# Patient Record
Sex: Female | Born: 1950 | Race: Black or African American | Hispanic: No | Marital: Married | Smoking: Former smoker
Health system: Southern US, Community
[De-identification: ages and names within clinical notes are randomized; demographics above are authoritative.]

## PROBLEM LIST (undated history)

## (undated) DIAGNOSIS — F419 Anxiety disorder, unspecified: Secondary | ICD-10-CM

## (undated) DIAGNOSIS — T7840XA Allergy, unspecified, initial encounter: Secondary | ICD-10-CM

## (undated) DIAGNOSIS — M1990 Unspecified osteoarthritis, unspecified site: Secondary | ICD-10-CM

## (undated) DIAGNOSIS — A64 Unspecified sexually transmitted disease: Secondary | ICD-10-CM

## (undated) DIAGNOSIS — I10 Essential (primary) hypertension: Secondary | ICD-10-CM

## (undated) DIAGNOSIS — R32 Unspecified urinary incontinence: Secondary | ICD-10-CM

## (undated) DIAGNOSIS — M722 Plantar fascial fibromatosis: Secondary | ICD-10-CM

## (undated) HISTORY — DX: Essential (primary) hypertension: I10

## (undated) HISTORY — DX: Unspecified sexually transmitted disease: A64

## (undated) HISTORY — DX: Unspecified osteoarthritis, unspecified site: M19.90

## (undated) HISTORY — DX: Allergy, unspecified, initial encounter: T78.40XA

## (undated) HISTORY — DX: Plantar fascial fibromatosis: M72.2

## (undated) HISTORY — DX: Unspecified urinary incontinence: R32

## (undated) HISTORY — DX: Anxiety disorder, unspecified: F41.9

---

## 1998-06-23 ENCOUNTER — Emergency Department (HOSPITAL_COMMUNITY): Admission: EM | Admit: 1998-06-23 | Discharge: 1998-06-23 | Payer: Self-pay | Admitting: Emergency Medicine

## 1998-07-02 ENCOUNTER — Emergency Department (HOSPITAL_COMMUNITY): Admission: EM | Admit: 1998-07-02 | Discharge: 1998-07-02 | Payer: Self-pay

## 1998-07-03 ENCOUNTER — Ambulatory Visit (HOSPITAL_COMMUNITY): Admission: EM | Admit: 1998-07-03 | Discharge: 1998-07-03 | Payer: Self-pay | Admitting: Emergency Medicine

## 2002-11-21 ENCOUNTER — Emergency Department (HOSPITAL_COMMUNITY): Admission: EM | Admit: 2002-11-21 | Discharge: 2002-11-21 | Payer: Self-pay | Admitting: Emergency Medicine

## 2002-11-21 ENCOUNTER — Encounter: Payer: Self-pay | Admitting: Emergency Medicine

## 2004-02-12 ENCOUNTER — Emergency Department (HOSPITAL_COMMUNITY): Admission: EM | Admit: 2004-02-12 | Discharge: 2004-02-12 | Payer: Self-pay | Admitting: Emergency Medicine

## 2004-07-26 ENCOUNTER — Emergency Department (HOSPITAL_COMMUNITY): Admission: EM | Admit: 2004-07-26 | Discharge: 2004-07-26 | Payer: Self-pay | Admitting: Emergency Medicine

## 2005-08-13 ENCOUNTER — Emergency Department (HOSPITAL_COMMUNITY): Admission: EM | Admit: 2005-08-13 | Discharge: 2005-08-13 | Payer: Self-pay | Admitting: Emergency Medicine

## 2005-10-09 ENCOUNTER — Emergency Department (HOSPITAL_COMMUNITY): Admission: EM | Admit: 2005-10-09 | Discharge: 2005-10-09 | Payer: Self-pay | Admitting: Emergency Medicine

## 2006-01-05 ENCOUNTER — Emergency Department (HOSPITAL_COMMUNITY): Admission: EM | Admit: 2006-01-05 | Discharge: 2006-01-05 | Payer: Self-pay | Admitting: Emergency Medicine

## 2006-02-27 ENCOUNTER — Emergency Department (HOSPITAL_COMMUNITY): Admission: EM | Admit: 2006-02-27 | Discharge: 2006-02-27 | Payer: Self-pay | Admitting: Emergency Medicine

## 2006-10-02 ENCOUNTER — Emergency Department (HOSPITAL_COMMUNITY): Admission: EM | Admit: 2006-10-02 | Discharge: 2006-10-02 | Payer: Self-pay | Admitting: Emergency Medicine

## 2006-10-21 ENCOUNTER — Emergency Department (HOSPITAL_COMMUNITY): Admission: EM | Admit: 2006-10-21 | Discharge: 2006-10-21 | Payer: Self-pay | Admitting: Emergency Medicine

## 2007-04-12 ENCOUNTER — Ambulatory Visit: Payer: Self-pay | Admitting: Cardiovascular Disease

## 2007-04-12 ENCOUNTER — Observation Stay (HOSPITAL_COMMUNITY): Admission: EM | Admit: 2007-04-12 | Discharge: 2007-04-13 | Payer: Self-pay | Admitting: Emergency Medicine

## 2007-10-30 ENCOUNTER — Emergency Department (HOSPITAL_COMMUNITY): Admission: EM | Admit: 2007-10-30 | Discharge: 2007-10-30 | Payer: Self-pay | Admitting: Emergency Medicine

## 2008-02-26 ENCOUNTER — Emergency Department (HOSPITAL_COMMUNITY): Admission: EM | Admit: 2008-02-26 | Discharge: 2008-02-26 | Payer: Self-pay | Admitting: Emergency Medicine

## 2008-05-03 IMAGING — CR DG CHEST 2V
2 series · 2 of 2 positions shown · non-contrast
Comparison: 10/02/06.

CLINICAL DATA: 56-year-old female with chest pain. Cough.
 CHEST - 2 VIEW:

[w chest pa]
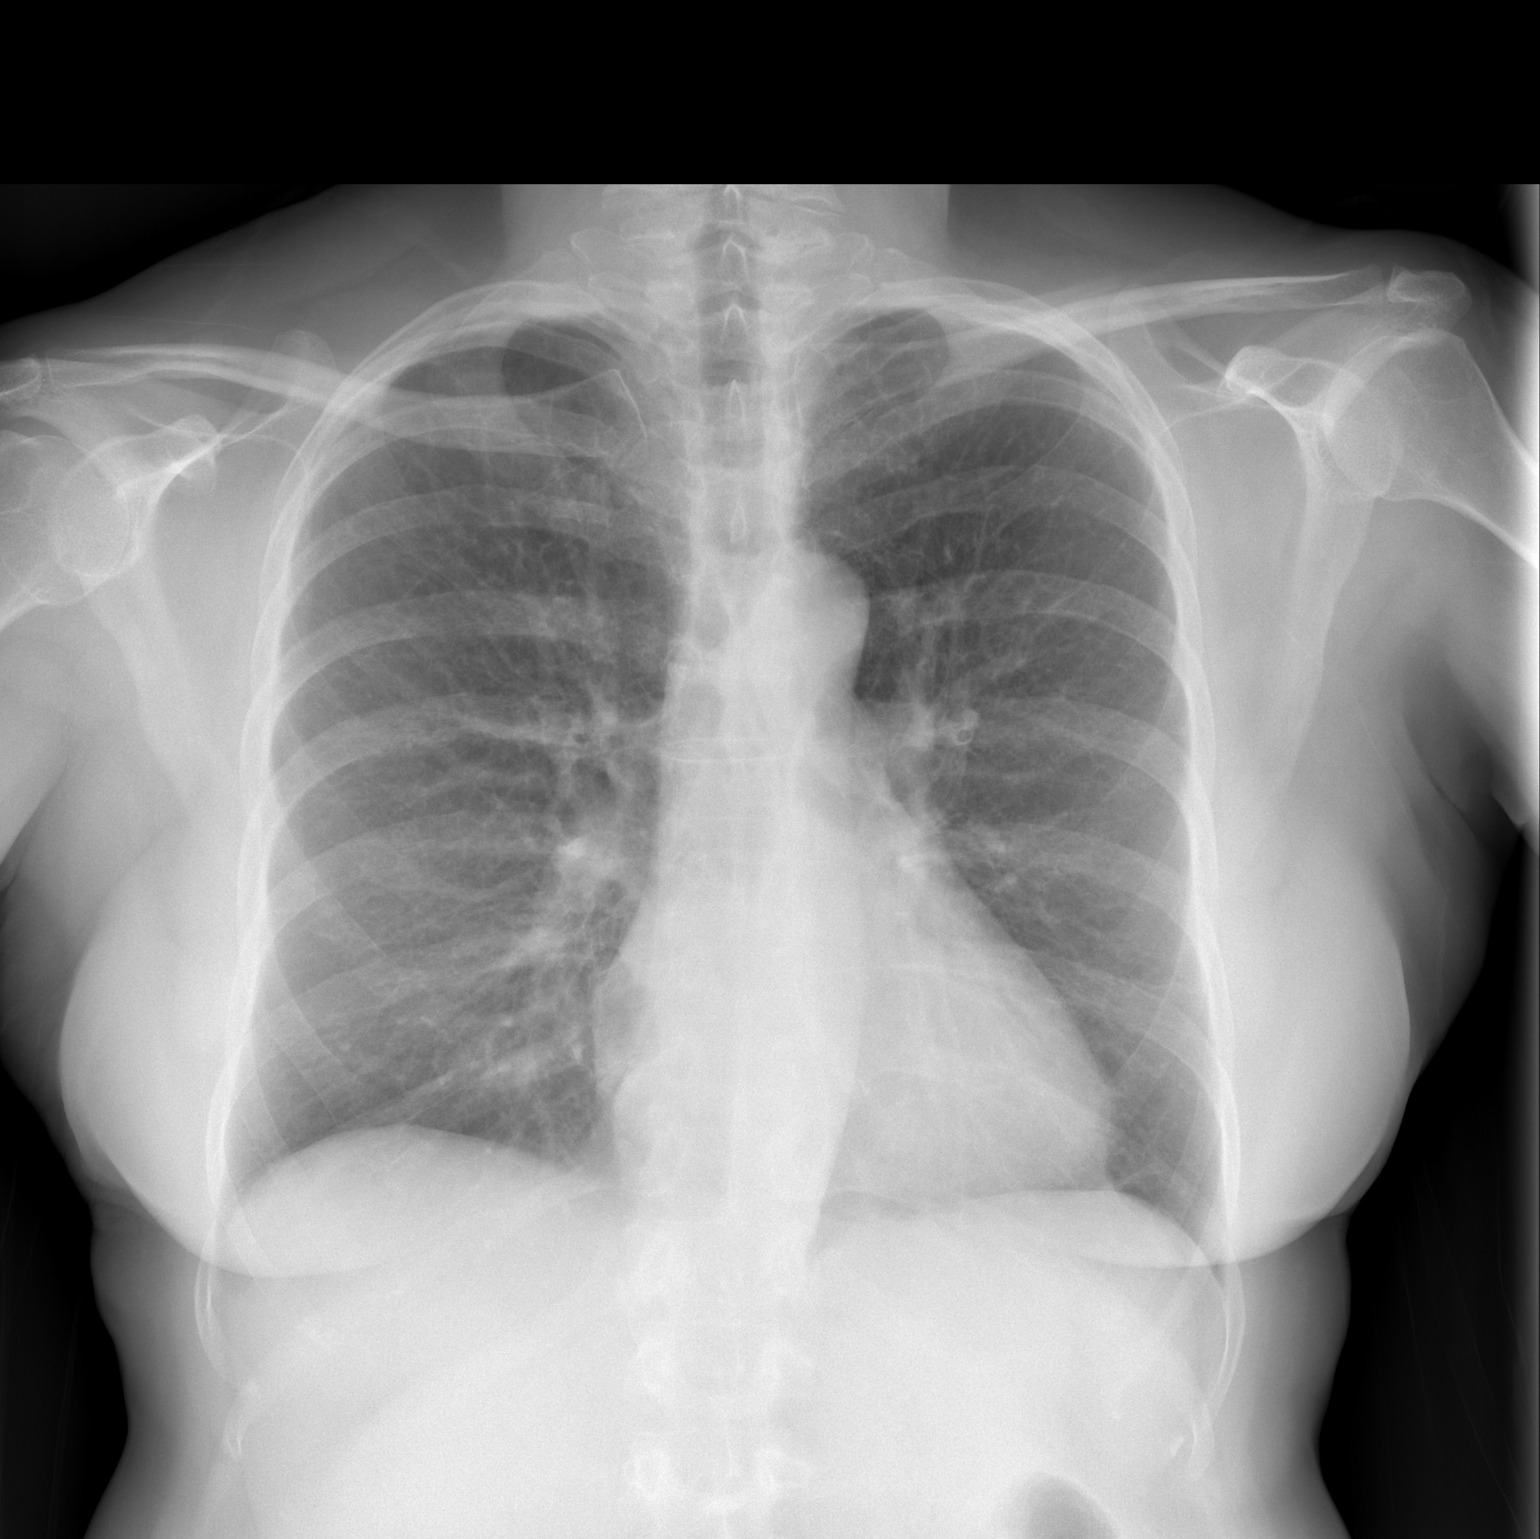

[w chest lat]
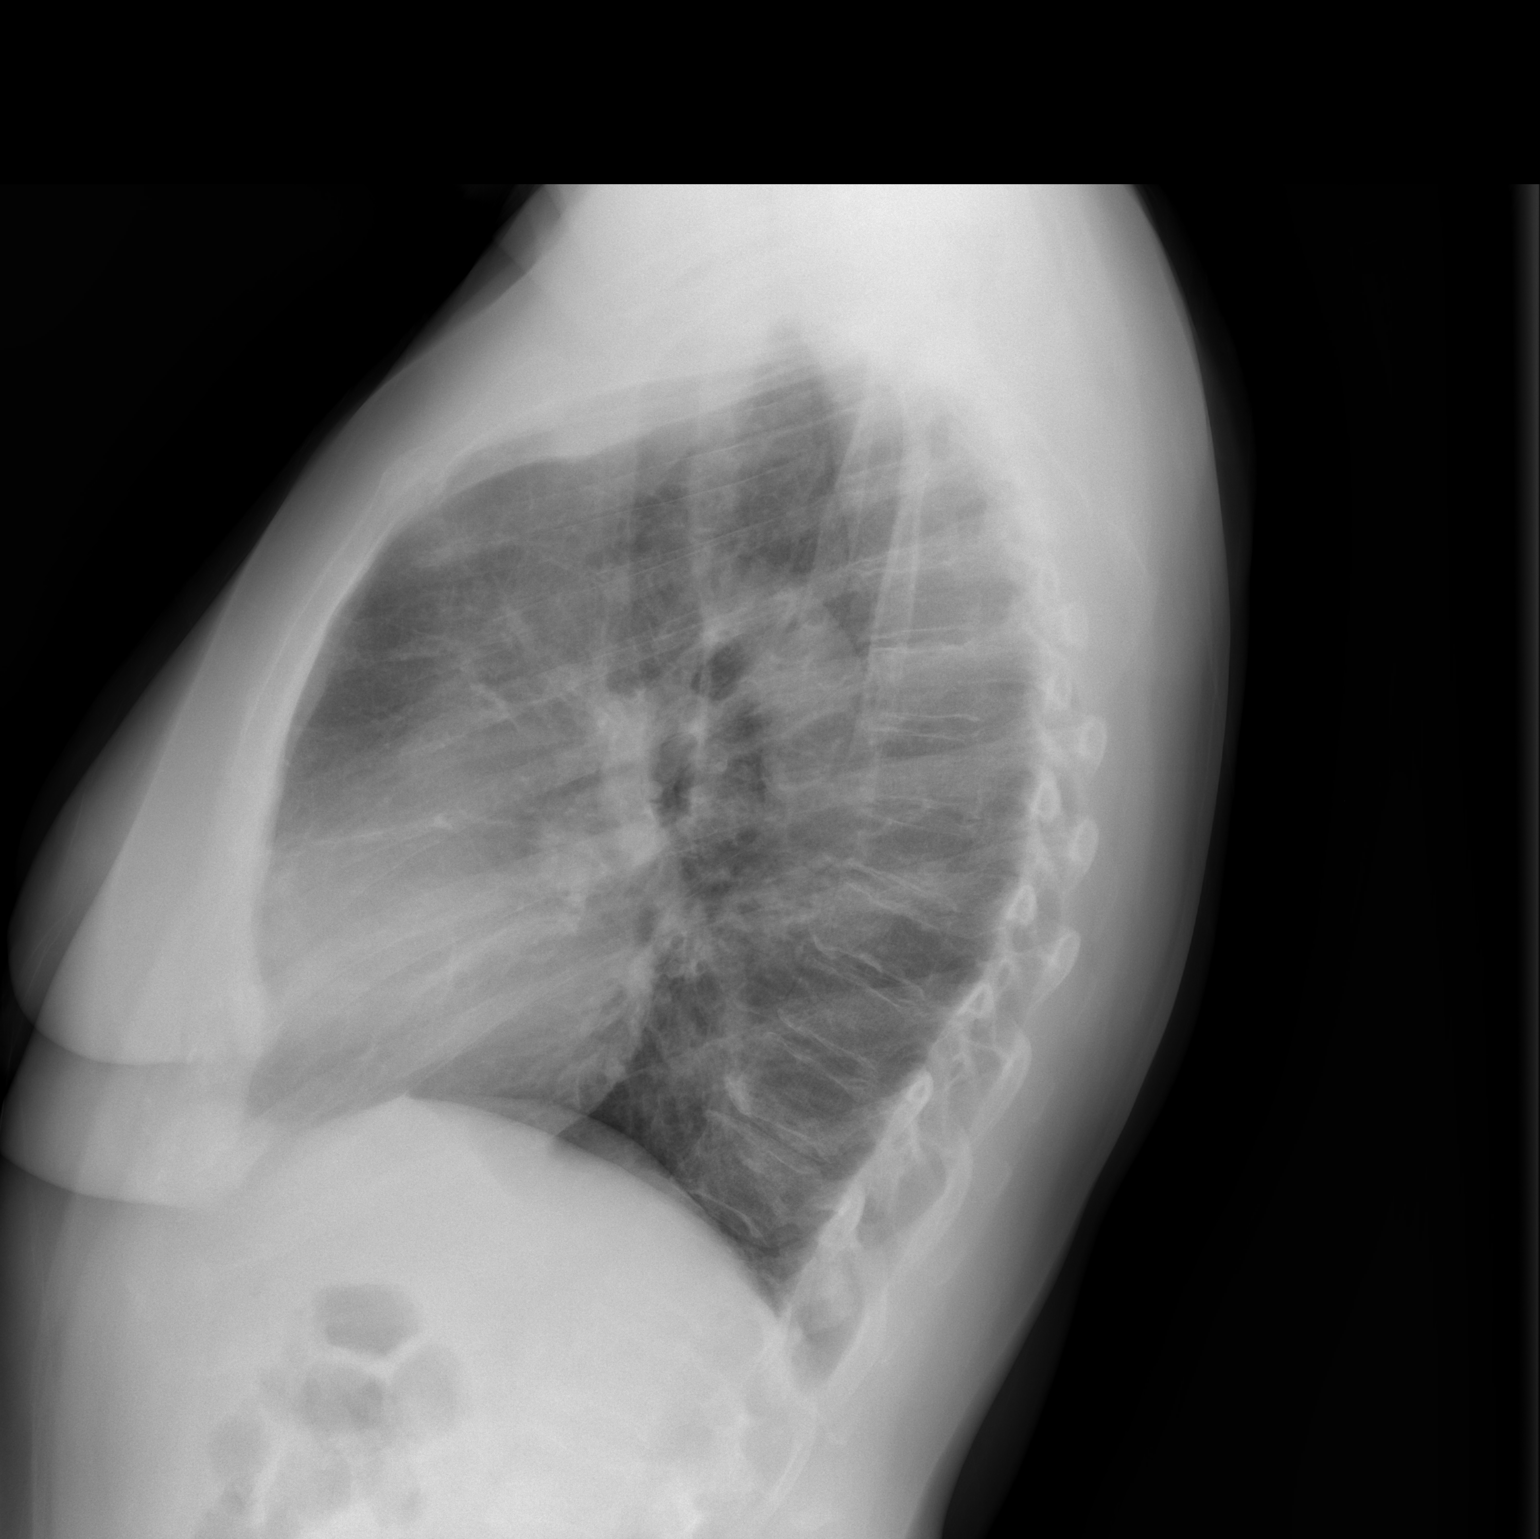

[2 of 2 positions shown; findings below may reference images not displayed]

FINDINGS: There is slight increased prominence in interstitial and bronchial markings in the upper lobes bilaterally.  No focal airspace disease is identified.  Degenerative endplate changes are noted through the thoracic spine similar to prior study.
IMPRESSION: Diffuse increased interstitial and bronchitic markings suggestive of acute bronchitis without focal airspace disease.

## 2008-10-20 ENCOUNTER — Ambulatory Visit (HOSPITAL_COMMUNITY): Admission: RE | Admit: 2008-10-20 | Discharge: 2008-10-20 | Payer: Self-pay | Admitting: Family Medicine

## 2009-01-25 ENCOUNTER — Ambulatory Visit (HOSPITAL_COMMUNITY): Admission: RE | Admit: 2009-01-25 | Discharge: 2009-01-25 | Payer: Self-pay | Admitting: Family Medicine

## 2009-02-09 ENCOUNTER — Emergency Department (HOSPITAL_COMMUNITY): Admission: EM | Admit: 2009-02-09 | Discharge: 2009-02-09 | Payer: Self-pay | Admitting: Emergency Medicine

## 2009-07-24 ENCOUNTER — Emergency Department (HOSPITAL_COMMUNITY): Admission: EM | Admit: 2009-07-24 | Discharge: 2009-07-24 | Payer: Self-pay | Admitting: Emergency Medicine

## 2010-03-03 IMAGING — CT CT CERVICAL SPINE W/O CM
3 of 4 series · 11 of 20 positions shown, 12 images · non-contrast
Comparison: None

CT HEAD

CLINICAL DATA: Dizziness

CT HEAD WITHOUT CONTRAST
CT CERVICAL SPINE WITHOUT CONTRAST
TECHNIQUE: Multidetector CT imaging of the head and cervical spine
was performed following the standard protocol without intravenous
contrast.  Multiplanar CT image reconstructions of the cervical
spine were also generated.

[Series 5: c_spine 2.0 b31s · axial · 0.23mm/px · z∈[+946,+1054]mm · 4 of 92 slices shown]
[im 19/92  bone]
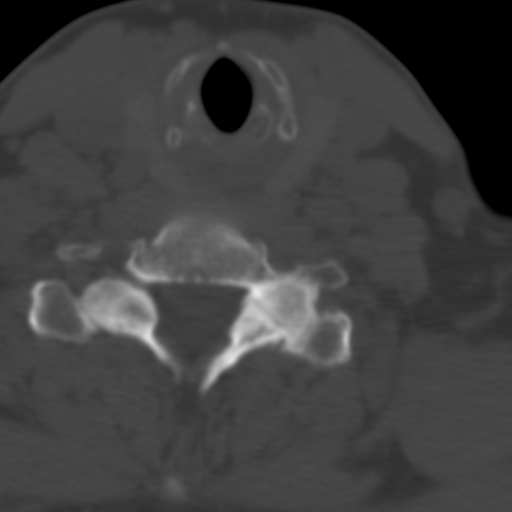
[im 37/92  bone]
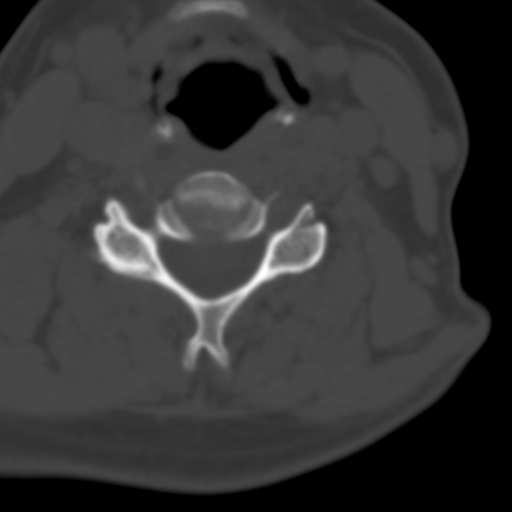
[im 55/92  bone]
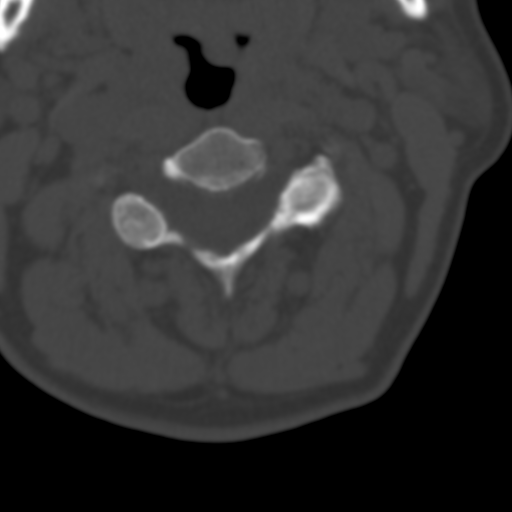
[im 73/92  bone]
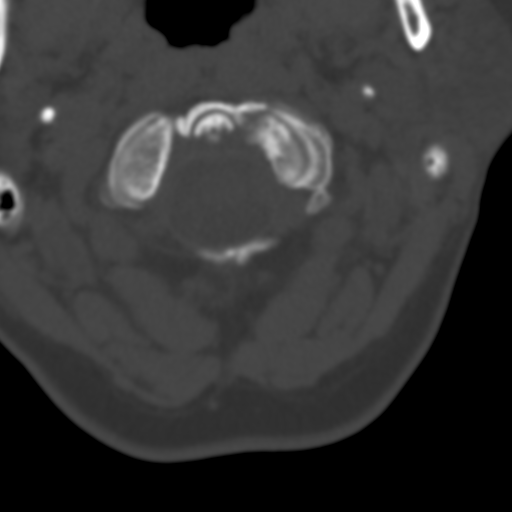

[Series 602: <mpr thick range> · coronal · 0.36mm/px · 3 of 36 slices shown]
[im 8/36  bone]
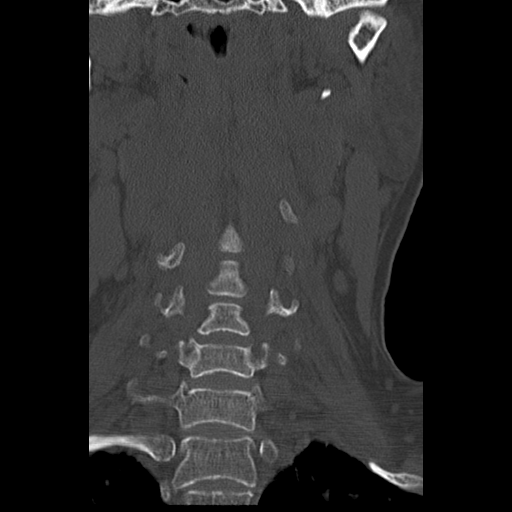
[im 15/36  bone]
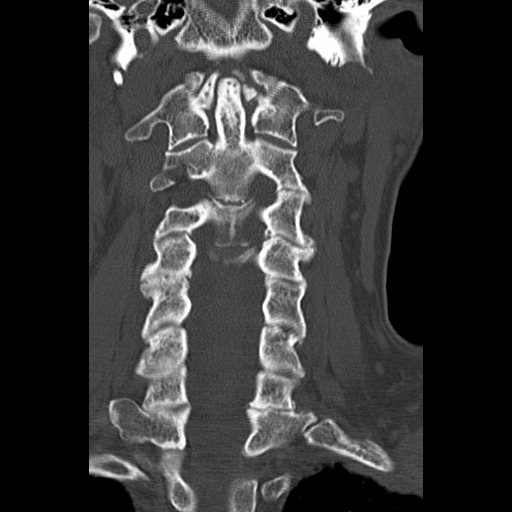
[im 22/36  bone]
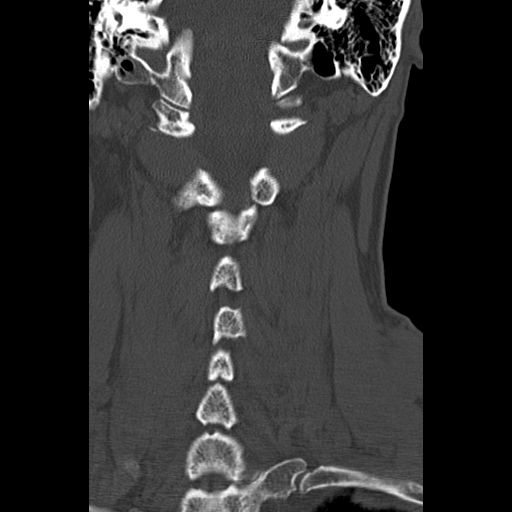

[Series 603: <mpr thick range(1)> · axial · 0.36mm/px · z∈[+905,+1008]mm · 4 of 93 slices shown, 5 images]
[im 19/93  soft-tissue]
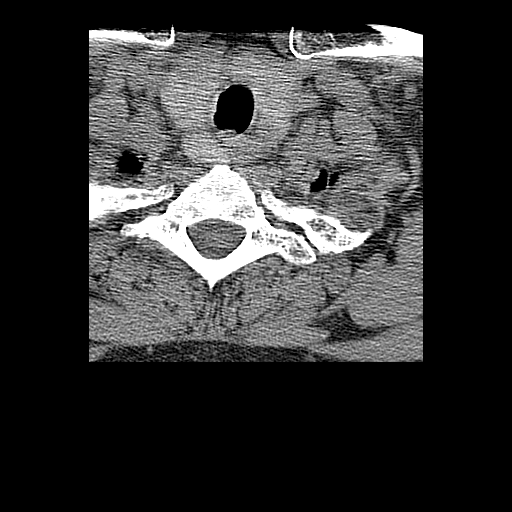
[im 19/93  bone]
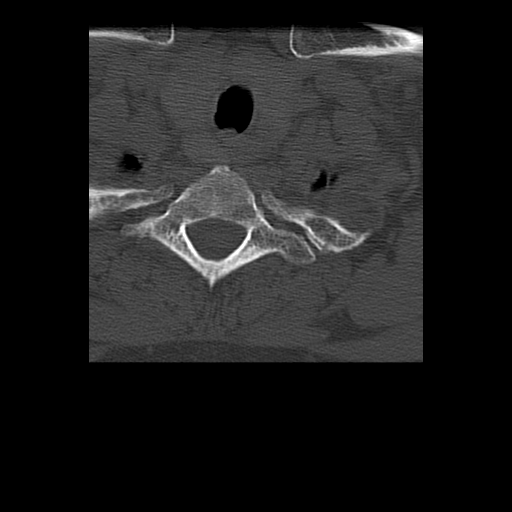
[im 37/93  bone]
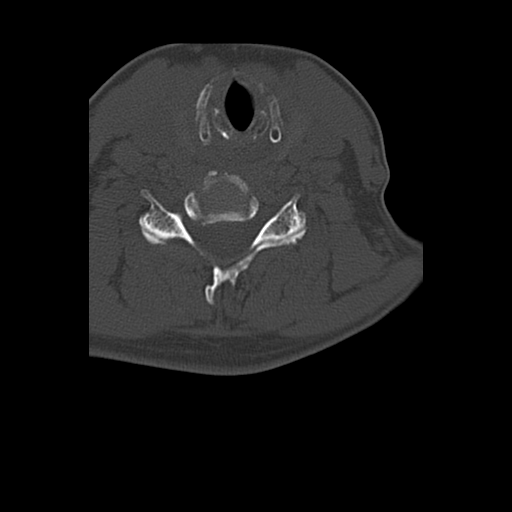
[im 56/93  bone]
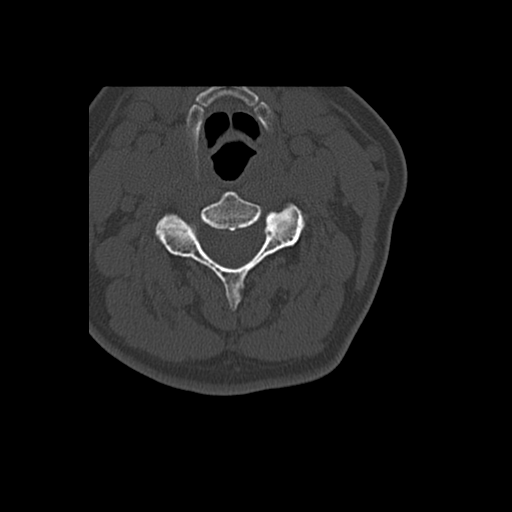
[im 74/93  bone]
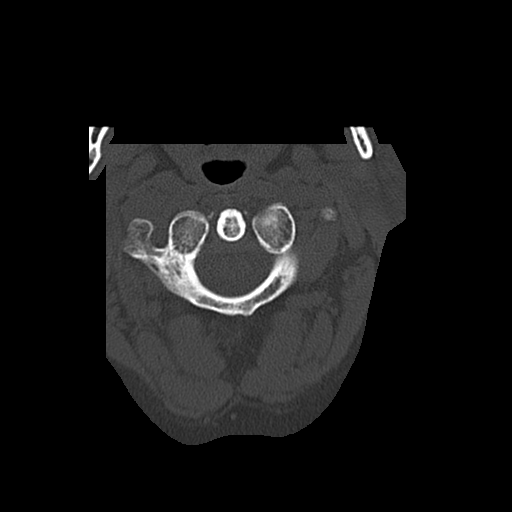

[11 of 20 positions shown; findings below may reference images not displayed]

FINDINGS: The brain has a normal appearance without evidence for hemorrhage,
infarction, hydrocephalus, or mass lesion.  There is no extra axial
fluid collection.  The skull and paranasal sinuses are normal.
IMPRESSION: 1.  No acute intracranial abnormalities.

CT CERVICAL SPINE
FINDINGS: The alignment of the cervical spine is normal.

The vertebral body heights and disc spaces are well preserved.

There is a facet degenerative changes noted bilaterally.

The prevertebral soft tissue space is normal. No fractures are
noted.

Limited imaging through the lung apices shows advanced
emphysematous changes.
IMPRESSION: 1.  No acute findings.
2.  Facet degenerative changes.
3.  Emphysema.

## 2011-02-10 LAB — BASIC METABOLIC PANEL
Calcium: 8.9 mg/dL (ref 8.4–10.5)
GFR calc non Af Amer: >60 mL/min (ref >60)
Glucose, Bld: 100 mg/dL — ABNORMAL HIGH (ref 70–99)
Potassium: 3.7 mEq/L (ref 3.5–5.1)
Sodium: 138 mEq/L (ref 135–145)

## 2011-02-10 LAB — CBC
HCT: 40.5 % (ref 36.0–46.0)
Platelets: 253 10*3/uL (ref 150–400)
RBC: 4.24 MIL/uL (ref 3.87–5.11)
WBC: 10.3 10*3/uL (ref 4.0–10.5)

## 2011-02-10 LAB — DIFFERENTIAL
Eosinophils Relative: 1 % (ref 0–5)
Lymphocytes Relative: 30 % (ref 12–46)
Lymphs Abs: 3.1 10*3/uL (ref 0.7–4.0)
Neutro Abs: 6.6 10*3/uL (ref 1.7–7.7)
Neutrophils Relative %: 64 % (ref 43–77)

## 2011-02-10 LAB — POCT CARDIAC MARKERS: CKMB, poc: <1 ng/mL — ABNORMAL LOW (ref 1.0–8.0)

## 2011-02-15 LAB — POCT CARDIAC MARKERS
CKMB, poc: <1 ng/mL — ABNORMAL LOW (ref 1.0–8.0)
Troponin i, poc: <0.05 ng/mL (ref 0.00–0.09)

## 2011-02-15 LAB — POCT I-STAT, CHEM 8
Calcium, Ion: 1.14 mmol/L (ref 1.12–1.32)
Chloride: 106 mEq/L (ref 96–112)
Creatinine, Ser: 0.9 mg/dL (ref 0.4–1.2)
HCT: 45 % (ref 36.0–46.0)
Hemoglobin: 15.3 g/dL — ABNORMAL HIGH (ref 12.0–15.0)
Potassium: 4 mEq/L (ref 3.5–5.1)
Sodium: 140 mEq/L (ref 135–145)
TCO2: 27 mmol/L (ref 0–100)

## 2011-02-15 LAB — URINE MICROSCOPIC-ADD ON

## 2011-02-15 LAB — CBC
Hemoglobin: 13.8 g/dL (ref 12.0–15.0)
MCHC: 33 g/dL (ref 30.0–36.0)
MCV: 94.8 fL (ref 78.0–100.0)
RBC: 4.4 MIL/uL (ref 3.87–5.11)
RDW: 13 % (ref 11.5–15.5)

## 2011-02-15 LAB — DIFFERENTIAL
Basophils Relative: 1 % (ref 0–1)
Eosinophils Absolute: 0.1 10*3/uL (ref 0.0–0.7)
Eosinophils Relative: 1 % (ref 0–5)
Lymphs Abs: 3 10*3/uL (ref 0.7–4.0)
Neutrophils Relative %: 67 % (ref 43–77)

## 2011-02-15 LAB — URINALYSIS, ROUTINE W REFLEX MICROSCOPIC
Protein, ur: NEGATIVE mg/dL
Urobilinogen, UA: 0.2 mg/dL (ref 0.0–1.0)

## 2011-03-21 NOTE — Consult Note (Signed)
NAME:  Brittany Gomez, Brittany Gomez                ACCOUNT NO.:  [id number]   MEDICAL RECORD NO.:  [id number]          PATIENT TYPE:  OBV   LOCATION:  1433                         FACILITY:  [facility]   PHYSICIAN:  Noralyn Pick. Eden Emms, MD, FACCDATE OF BIRTH:  26-Jan-1951   DATE OF CONSULTATION:  04/12/2007  DATE OF DISCHARGE:                                 CONSULTATION   REASON FOR CONSULTATION:  Ms. Kowalczyk was admitted to the hospital by  [facility] for chest pain; we are asked to evaluate her.   HISTORY OF PRESENT ILLNESS:  She is 60 years old.  She is a pack-a-day  smoker.  She came to the emergency room on April 12, 2007 with 7/10  substernal chest pain.  The pain started after eating fried fish last  night about 6:00, persisted throughout the night and was associated with  some diaphoresis.  There was some generalized tingling and numbness, as  well as sweating.   The patient did not have any significant nausea or vomiting.  There were  no loose stools or diarrhea.   The patient's pain is now essentially gone.  As far as I can tell, she  was not given a GI cocktail or nitro.   Point of care summary is negative in the ER.  Her coronary risk factors  are primarily remarkable for tobacco abuse.  There is also a family  history of congestive heart failure and heart attacks.  The patient is a  non-diabetic.  There is a history of cardiac enlargement but not  necessarily hypertension.   The patient is currently feeling good.  Actually, when I walked in the  room she was talking on the phone to her friend, in no distress, yet 10  minutes before that apparently she told the nurse she was having chest  pain.   PAST MEDICAL HISTORY:  Remarkable for smoking; she smokes a pack a day  and has been doing so for over 20 years.  There is some clinical history  for bronchitis or pneumonia.  There is a history of an enlarged heart;  she talks about a doctor in [facility] following her.  She described  having  had an ultrasound done.  There is no documented history of heart  failure or coronary artery disease.  She has not had a heart cath.  There was no cardiac enlargement on her admission chest x-ray.   PAST SURGICAL HISTORY:  Only other surgeries have include a cesarean  section.   ALLERGIES:  SHE HAS NO KNOWN ALLERGIES.   MEDICATIONS:  The patient does not take any medication on a regular  basis except for Goody's Powders.   SOCIAL HISTORY:  The patient is single.  She has 4 children who are  healthy.  She does not work.  She denies alcohol or drug use.  The  patient apparently has some legal issues in regards to tickets, and does  not have a valid driver's license and, therefore, says it is hard to  find employment.   FAMILY HISTORY:  Mother had congestive heart failure and died at  age 48  from this.  She also says that her mom had heart attacks before her  death, as well as diabetes.  Father died at age 31 of a stroke.   REVIEW OF SYSTEMS:  Otherwise negative.   PHYSICAL EXAMINATION:  VITAL SIGNS:  Afebrile, blood pressure is 130/80,  pulse is 76 and regular, and respirations are 15.  Sat is 99% on room  air.  GENERAL:  She is in no distress and actually somewhat histrionic.  HEENT:  Normal.  NECK:  There is no lymphadenopathy and no thyromegaly.  No JVP  elevation.  LUNGS:  Clear with no wheezing and normal diaphragmatic motion.  HEART:  S1 and S2 with normal heart sounds.  There is no rub.  PMI is  normal.  ABDOMEN:  Benign.  There is minimal epigastric pain.  There is no  rebound.  There is no AAA.  No hepatosplenomegaly.  No hepatojugular  reflux and no Murphy's sign.  EXTREMITIES:  Femorals are +3 bilaterally without bruit.  PT's are +2  bilaterally.  There is no edema.  NEUROLOGIC:  Nonfocal.  There is no muscular weakness.   DATA REVIEWED:  EKG initially showed limb lead reversal and, therefore,  was misread as a lateral infarct.  Her second EKG was essentially   normal.   Her point of care enzymes are negative.   Her chest x-ray shows bronchitic changes without cardiomegaly.   Labs are remarkable for a normal TSH, LDL of 154 and BNP less than 30.  Enzymes are negative.  UA is normal.  D-dimer is less than 0.22.  K is  3.8, BUN is 10 and creatinine is 0.85.  Hematocrit is 40.5.   IMPRESSION AND RECOMMENDATIONS:  1. Patient's chest pain is atypical; I doubt it is coronary in nature.      I wrote her for 40 of Protonix and a GI cocktail.  She will be      ruled out for a myocardial infarction.  She will have a 2-D      echocardiogram to rule out regional wall motion abnormalities.  I      suspect she will be able to go home in the morning for an      outpatient Myoview.  2. History of cardiac enlargement; doubt significant history of      cardiomyopathy.  If she has one, it would likely be nonischemic.      Her chest x-ray did not show cardiac enlargement.  We will find out      what her heart size is with a 2-D echocardiogram.  3. LDL of 154; this is clearly non-ideal.  However, the patient does      not appear to be very reliable.  There is no documented coronary      artery disease.  I think an initial dietary consultation should be      in order.  I would not start her on statin drugs unless it proves      to be that she has significant cardiomyopathy or coronary artery      disease.  4. Epigastric pain.  Continue GI cocktail and Protonix.  May need      further workup for this.  Her entire clinical scenario started      after eating fried fish.  There is no evidence of gallbladder      disease.  I would continue to monitor her for GI status.   DISPOSITION:  Again, the patient has  a graduation to go to tomorrow and  I would discharge her in the morning for outpatient follow up, so long  as her pain does not return and her echo looks normal.      Theron Arista C. Eden Emms, MD, [facility] Electronically Signed     PCN/MEDQ  D:  04/12/2007  T:   04/12/2007  Job:  [id number]

## 2011-03-21 NOTE — Discharge Summary (Signed)
NAME:  Brittany Gomez, Brittany Gomez                ACCOUNT NO.:  [id number]   MEDICAL RECORD NO.:  [id number]          PATIENT TYPE:  OBV   LOCATION:  1433                         FACILITY:  [facility]   PHYSICIAN:  Madaline Savage, MD        DATE OF BIRTH:  08-06-1951   DATE OF ADMISSION:  04/12/2007  DATE OF DISCHARGE:  04/13/2007                               DISCHARGE SUMMARY   PRIMARY CARE PHYSICIAN:  Dr. Ronne Binning.   CONSULTATIONS:  In the hospital:  She was seen by Dr. Charlton Haws from  cardiology.   DISCHARGE DIAGNOSES:  1. Chest pain.  Myocardial infarction ruled out with three sets of      negative cardiac markers and negative EKG.  2. Tobaccoism.  3. History of questionable enlarged heart.   DISCHARGE MEDICATIONS:  1. Aspirin 81 mg once a daily.  2. Lopressor 12.5 mg twice daily.   HISTORY OF PRESENT ILLNESS:  For a full history and physical, see the  history and physical dictated by Dr. Renae Fickle on April 12, 2007.  In short,  Brittany Gomez is a 60 year old African-American lady with a past history  significant for tobacco abuse, who comes in with a atypical chest pain.  The pain did have some typical features, and so cardiology was  consulted, and she was admitted for ruling out an acute coronary  syndrome.   PROBLEM LIST:  1. Chest pain.  She was admitted, and three sets of cardiac markers      were done which were all negative, and she was seen by cardiology      Dr. Eden Emms.  At this time, the plan is to discharge her home and do      the workup as an outpatient.  Dr. Eden Emms is going to arrange for      outpatient echocardiogram and possible cardiac cath, and she will      be followed by Dr. Eden Emms as an outpatient.  I will continue her on      the aspirin and beta blocker, which was started in the hospital.  2. Tobaccoism.  She was put on a nicotine patch while she was in the      hospital, and she was counseled on smoking cessation.  3. Elevated cholesterol.  She had a fast lipid profile  done in the      hospital, which showed a total cholesterol of 216, HDL of 45,      triglyceride of 84 and LDL of 154.  At this point in time, we would      try lifestyle modifications, and this should be followed up as an      outpatient.   DISPOSITION:  She is now being discharged home in stable condition.   FOLLOW UP:  She is asked to follow-up with her primary care doctor, Dr.  Ronne Binning in 1 week, and also follow up with Dr. Eden Emms.  She will be  given the phone number of Dr. Eden Emms [phone].      Madaline Savage, MD  Electronically Signed     PKN/MEDQ  D:  04/13/2007  T:  04/13/2007  Job:  [id number]   cc:   Dr. Lamar Laundry C. Eden Emms, MD, [facility]  [address]  [address]  [place]  [place] [address]

## 2011-03-21 NOTE — H&P (Signed)
NAME:  Brittany Gomez, Brittany Gomez                ACCOUNT NO.:  [id number]   MEDICAL RECORD NO.:  [id number]          PATIENT TYPE:  OBV   LOCATION:  0101                         [facility]ACILITY:  [facility]   PHYSICIAN:  Unknown                DATE OF BIRTH:  01-19-1951   DATE OF ADMISSION:  04/12/2007  DATE OF DISCHARGE:                              HISTORY & PHYSICAL   REASON FOR ADMISSION:  Chest pain, rule out myocardial infarction.   HISTORY OF PRESENT ILLNESS:  Ms. Formby is a very pleasant 60 year old  African American female, with a past medical history significant for  tobacco abuse, that comes to the emergency room because of 7/10  substernal chest discomfort that started about 30 minutes after the  patient had some fried fish at 6 p.m. yesterday evening.  The pain  persisted through the night and got worse this morning and was  associated with shortness of breath that was especially worse when the  patient took a deep breath and with tingling, numbness and generalized  sweating and, thus, she decided to come to the emergency room.  The  patient also says she has had palpitations that almost feel like a  flutter for the past month.  She also had diaphoresis with her episodes  of chest pain.  Currently, she does not complain of any chest pain.  She  did not have any loss of consciousness.  She denies any nausea or  vomiting.  She also denies fever, chills, sputum production, but does  have a smoker's cough that is her baseline.  She does complain of  orthopnea and has to sleep on two pillows for the past many years and  does complain of some leg edema but it has not gotten worse in the past  couple of days.   PAST MEDICAL HISTORY:  1. Significant for tobacco abuse.  The patient smokes half a pack to      one pack a day for over 20 years. 2.  Hospital admission for      pneumonia in 2002.  2. History of enlarged heart. (The patient was told this with her      admission in 2002.  She says she had  an echocardiogram done which      was abnormal but she is not sure of the result.  She was placed on      some form of medication but then did not follow up with them and      did not continue the medication because of lack of insurance.)      Note, the patient did not have any cardiac catheterization done in      the past.  3. History of C-sections.   ALLERGIES:  NO KNOWN DRUG ALLERGIES.   MEDICATIONS:  The patient occasionally uses Goody Powder for headaches  and tooth pain but says she has not used it for the past couple of  months.   SOCIAL HISTORY:  The patient is currently single.  She has 4 kids that  are healthy.  She is a current smoker and smokes half a pack to one pack  a day for over 20 years.  She denies alcohol use.  She denies drug use,  especially cocaine use.   FAMILY HISTORY:  Mother had congestive heart failure and died from it at  age 31.  Per patient, she did have heart attacks during her lifetime.  She also had diabetes.  Father died of a stroke at age 110.  One of the  sisters died at age 77 from a diabetic coma.   REVIEW OF SYSTEMS:  As per HPI.   VITAL SIGNS ON ADMISSION:  Temperature 97.6, blood pressure 135/91,  pulse 82, respirations 16, O2 sats 98% on room air.   PHYSICAL EXAMINATION:  The patient is in no acute distress.  HEENT EXAM:  Pupils equal, round, reactive to light and accommodation.  Oropharynx is clear.  NECK:  Is supple.  There is no JVD.  There is no bruit.  CARDIOVASCULAR:  Regular rate and rhythm.  There are no murmurs.  CHEST:  Is clear to auscultation bilaterally.  No rales, rhonchi,  wheezing.  ABDOMEN:  Is soft, obese, nontender.  Positive bowel sounds.  EXTREMITIES:  Trace pedal edema.  There is no clubbing or cyanosis.  NEUROLOGICALLY:  Alert and oriented x4 and nonfocal exam.   LABS:  Hemoglobin is 13.2, white count 9.8, platelets 266,000.  The UA  was essentially negative with moderate hemoglobin but RBCs were 0-2 and  white  cells were 3-6 and calcium oxalate crystals were present.  Sodium  141, potassium 3.8, chloride 107, bicarb 29, glucose 109 (note, this was  a fasting sample, thus it is elevated), BUN 10, creatinine 0.85, calcium  9.1.  The D-dimer was less than 0.22.  Two sets of cardiac markers,  point of care markers were negative.  An EKG shows normal sinus rhythm  with a possible left atrial enlargement and a possible left axis  deviation but no acute ST or T wave changes suggestive of ischemia.  Chest x-ray showed diffuse increased interstitial markings suggestive of  acute bronchitis versus, less likely, minimal edema.  There is no  effusion.  It did not mention anything about an enlarged heart.   PLAN:  1. Chest pain, rule out MI.  The patient does have risk factors for      coronary artery disease, namely tobacco abuse and a positive family      history and probably borderline diabetes given her sugar of 109.      She was also told that she does have elevated cholesterol by her      PCP, however, she was asked to do lifestyle modifications before      drugs would be prescribed.  We will go ahead and admit the patient      on telemetry and rule her out with cyclic cardiac enzymes.  I will      also obtain a urine for UDS for cocaine.  We will obtain fasting      lipid panels and repeat a fasting blood sugar again in the morning.      We will start the patient on Chantix and also have a tobacco      cessation consult.  Finally, will order a 2-D  echocardiogram to      evaluate this history of enlarged heart, especially since the      patient was told she had an abnormal finding on 2-D echo when it  was done in 2002 and was put on some form of medication.  If there      is anything abnormal with her echocardiogram or if her enzymes are      elevated, will go ahead and call a cardiology consult but, for now,     will hold off on that until further evaluation is done since the      stress test  can be done as an outpatient.  2. Tobacco abuse.  As mentioned above, will start her Chantix and also      get a smoking cessation consult.  3. Borderline blood sugar.  Note, the patient's CBG was 109.  She does      have a family history of diabetes.  Will, again, recheck in the      morning and will also check postprandial blood sugars today as      well.  4. Questionable acute bronchitis on chest x-ray.  The patient does not      give any symptoms of fevers, chills,      sputum production.  Thus, at the most, it may be just a viral      bronchitis which I do not think needs any antibiotics.  Will      continue to follow her.  Will get a BMP and a 2-D echocardiogram as      mentioned above to evaluate her for congestive heart failure.     ______________________________  Unknown    ______________________________  Terrace Arabia  D:  04/12/2007  T:  04/12/2007  Job:  [id number]   cc:   Lorelle Formosa, M.D.  Fax: [phone]

## 2011-03-27 ENCOUNTER — Other Ambulatory Visit: Payer: Self-pay | Admitting: Family Medicine

## 2011-03-27 DIAGNOSIS — R52 Pain, unspecified: Secondary | ICD-10-CM

## 2011-03-30 ENCOUNTER — Other Ambulatory Visit: Payer: Self-pay

## 2011-04-06 ENCOUNTER — Other Ambulatory Visit: Payer: Self-pay

## 2011-05-01 ENCOUNTER — Other Ambulatory Visit: Payer: Self-pay | Admitting: Family Medicine

## 2011-05-01 DIAGNOSIS — Z1231 Encounter for screening mammogram for malignant neoplasm of breast: Secondary | ICD-10-CM

## 2011-05-29 ENCOUNTER — Ambulatory Visit
Admission: RE | Admit: 2011-05-29 | Discharge: 2011-05-29 | Disposition: A | Discharge status: Home / Self Care | Payer: Medicaid Other | Source: Ambulatory Visit | Attending: Family Medicine | Admitting: Family Medicine

## 2011-05-29 DIAGNOSIS — Z1231 Encounter for screening mammogram for malignant neoplasm of breast: Secondary | ICD-10-CM

## 2011-07-15 ENCOUNTER — Emergency Department (HOSPITAL_COMMUNITY): Payer: Medicaid Other

## 2011-07-15 ENCOUNTER — Emergency Department (HOSPITAL_COMMUNITY)
Admission: EM | Admit: 2011-07-15 | Discharge: 2011-07-15 | Disposition: A | Discharge status: Home / Self Care | Payer: Medicaid Other | Attending: Emergency Medicine | Admitting: Emergency Medicine

## 2011-07-15 DIAGNOSIS — R10819 Abdominal tenderness, unspecified site: Secondary | ICD-10-CM | POA: Insufficient documentation

## 2011-07-15 DIAGNOSIS — R300 Dysuria: Secondary | ICD-10-CM | POA: Insufficient documentation

## 2011-07-15 DIAGNOSIS — N390 Urinary tract infection, site not specified: Secondary | ICD-10-CM | POA: Insufficient documentation

## 2011-07-15 LAB — COMPREHENSIVE METABOLIC PANEL
Alkaline Phosphatase: 71 U/L (ref 39–117)
BUN: 8 mg/dL (ref 6–23)
CO2: 29 mEq/L (ref 19–32)
Calcium: 9.4 mg/dL (ref 8.4–10.5)
GFR calc Af Amer: >60 mL/min (ref >60)
GFR calc non Af Amer: >60 mL/min (ref >60)
Glucose, Bld: 90 mg/dL (ref 70–99)
Potassium: 4 mEq/L (ref 3.5–5.1)
Total Protein: 6.7 g/dL (ref 6.0–8.3)

## 2011-07-15 LAB — DIFFERENTIAL
Lymphocytes Relative: 21 % (ref 12–46)
Lymphs Abs: 3.5 10*3/uL (ref 0.7–4.0)
Monocytes Absolute: 0.8 10*3/uL (ref 0.1–1.0)
Monocytes Relative: 5 % (ref 3–12)
Neutro Abs: 12.2 10*3/uL — ABNORMAL HIGH (ref 1.7–7.7)

## 2011-07-15 LAB — CBC
HCT: 40.3 % (ref 36.0–46.0)
Hemoglobin: 13.2 g/dL (ref 12.0–15.0)
MCH: 30.4 pg (ref 26.0–34.0)
MCHC: 32.8 g/dL (ref 30.0–36.0)
RBC: 4.34 MIL/uL (ref 3.87–5.11)

## 2011-07-15 LAB — URINALYSIS, ROUTINE W REFLEX MICROSCOPIC
Bilirubin Urine: NEGATIVE
Nitrite: NEGATIVE
Protein, ur: >300 mg/dL — AB
Specific Gravity, Urine: 1.021 (ref 1.005–1.030)
Urobilinogen, UA: 0.2 mg/dL (ref 0.0–1.0)

## 2011-07-17 LAB — URINE CULTURE: Colony Count: >=100000

## 2011-08-01 LAB — BASIC METABOLIC PANEL
BUN: 5 — ABNORMAL LOW
Chloride: 106
GFR calc non Af Amer: >60
Potassium: 4.4
Sodium: 141

## 2011-08-01 LAB — CBC
HCT: 40.3
Hemoglobin: 13.3
MCV: 94
Platelets: 250
RBC: 4.28
WBC: 10

## 2011-08-01 LAB — DIFFERENTIAL
Eosinophils Absolute: 0.1
Eosinophils Relative: 1
Lymphocytes Relative: 26
Lymphs Abs: 2.6
Monocytes Absolute: 0.6
Monocytes Relative: 6

## 2011-08-01 LAB — RAPID STREP SCREEN (MED CTR MEBANE ONLY): Streptococcus, Group A Screen (Direct): NEGATIVE

## 2011-08-01 LAB — POCT CARDIAC MARKERS: Troponin i, poc: <0.05

## 2011-08-24 LAB — DIFFERENTIAL
Basophils Absolute: 0.1
Basophils Relative: 1
Eosinophils Absolute: 0.2
Eosinophils Relative: 2
Monocytes Absolute: 0.5
Monocytes Relative: 5

## 2011-08-24 LAB — URINE MICROSCOPIC-ADD ON

## 2011-08-24 LAB — CBC
Hemoglobin: 13.2
MCHC: 32.6
MCV: 93.1
RBC: 4.35
RDW: 13.2

## 2011-08-24 LAB — RAPID URINE DRUG SCREEN, HOSP PERFORMED
Amphetamines: NOT DETECTED
Barbiturates: NOT DETECTED
Benzodiazepines: POSITIVE — AB
Opiates: POSITIVE — AB

## 2011-08-24 LAB — URINALYSIS, ROUTINE W REFLEX MICROSCOPIC
Glucose, UA: NEGATIVE
Ketones, ur: NEGATIVE
Leukocytes, UA: NEGATIVE
Nitrite: NEGATIVE
Specific Gravity, Urine: 1.039 — ABNORMAL HIGH
pH: 6

## 2011-08-24 LAB — BASIC METABOLIC PANEL
CO2: 29
Chloride: 107
GFR calc Af Amer: >60
Glucose, Bld: 109 — ABNORMAL HIGH
Sodium: 141

## 2011-08-24 LAB — LIPID PANEL
Cholesterol: 216 — ABNORMAL HIGH
HDL: 45

## 2011-08-24 LAB — CARDIAC PANEL(CRET KIN+CKTOT+MB+TROPI)
CK, MB: 2.3
Total CK: 166
Total CK: 206 — ABNORMAL HIGH
Troponin I: 0.04

## 2011-08-24 LAB — URINE CULTURE: Colony Count: NO GROWTH

## 2011-08-24 LAB — POCT CARDIAC MARKERS
CKMB, poc: 1.3
Myoglobin, poc: 51.6
Troponin i, poc: <0.05

## 2011-08-24 LAB — TSH: TSH: 0.714

## 2011-08-24 LAB — B-NATRIURETIC PEPTIDE (CONVERTED LAB): Pro B Natriuretic peptide (BNP): <30

## 2011-08-24 LAB — D-DIMER, QUANTITATIVE (NOT AT ARMC): D-Dimer, Quant: <0.22

## 2011-08-24 LAB — CK TOTAL AND CKMB (NOT AT ARMC): CK, MB: 2.1

## 2014-12-17 ENCOUNTER — Encounter (HOSPITAL_COMMUNITY): Payer: Self-pay | Admitting: Emergency Medicine

## 2014-12-17 ENCOUNTER — Emergency Department (HOSPITAL_COMMUNITY)
Admission: EM | Admit: 2014-12-17 | Discharge: 2014-12-17 | Disposition: A | Discharge status: Home / Self Care | Payer: Medicaid Other | Attending: Emergency Medicine | Admitting: Emergency Medicine

## 2014-12-17 DIAGNOSIS — M5441 Lumbago with sciatica, right side: Secondary | ICD-10-CM | POA: Insufficient documentation

## 2014-12-17 DIAGNOSIS — Z87891 Personal history of nicotine dependence: Secondary | ICD-10-CM | POA: Insufficient documentation

## 2014-12-17 MED ORDER — CYCLOBENZAPRINE HCL 10 MG PO TABS: 10.0000 mg | ORAL_TABLET | Freq: Two times a day (BID) | ORAL | Status: DC | PRN

## 2014-12-17 MED ORDER — PREDNISONE 20 MG PO TABS
40.0000 mg | ORAL_TABLET | Freq: Every day | ORAL | Status: DC
Start: 2014-12-17 — End: 2015-09-21

## 2014-12-17 MED ORDER — OXYCODONE-ACETAMINOPHEN 5-325 MG PO TABS
2.0000 | ORAL_TABLET | Freq: Once | ORAL | Status: AC
Start: 2014-12-17 — End: 2014-12-17
  Administered 2014-12-17: 2 via ORAL
  Filled 2014-12-17: qty 2

## 2014-12-17 MED ORDER — CYCLOBENZAPRINE HCL 10 MG PO TABS
10.0000 mg | ORAL_TABLET | Freq: Once | ORAL | Status: AC
  Administered 2014-12-17: 10 mg via ORAL
  Filled 2014-12-17: qty 1

## 2014-12-17 MED ORDER — OXYCODONE-ACETAMINOPHEN 5-325 MG PO TABS: 1.0000 | ORAL_TABLET | Freq: Four times a day (QID) | ORAL | Status: DC | PRN

## 2014-12-17 NOTE — ED Provider Notes (Signed)
CSN: [id number]     Arrival date & time 12/17/14  1015 History   First MD Initiated Contact with Patient 12/17/14 1032     Chief Complaint  Patient presents with  . Back Pain     (Consider location/radiation/quality/duration/timing/severity/associated sxs/prior Treatment) HPI Comments: Patient presents to the emergency department with chief complaint of right-sided low back pain with pain that radiates to the right lower extremity. She states that the pain first started a day or so ago. She reports that she has been doing a lot of lifting and thinks that this has aggravated her symptoms. She is also frequently lifting her grandchildren. She states the pain is moderate to severe. She denies any fevers chills. Denies any history of cancer. Denies any weakness, numbness, or tingling of the extremities. She denies any bowel or bladder incontinence.  The history is provided by the patient. No language interpreter was used.    History reviewed. No pertinent past medical history. Past Surgical History  Procedure Laterality Date  . Cesarean section     No family history on file. History  Substance Use Topics  . Smoking status: Former Smoker    Types: Cigarettes  . Smokeless tobacco: Not on file  . Alcohol Use: No   OB History    No data available     Review of Systems  Constitutional: Negative for fever and chills.  Gastrointestinal:       No bowel incontinence  Genitourinary:       No urinary incontinence  Musculoskeletal: Positive for myalgias, back pain and arthralgias.  Neurological:       No saddle anesthesia      Allergies  Review of patient's allergies indicates not on file.  Home Medications   Prior to Admission medications   Not on File   BP 120/87 mmHg  Pulse 109  Temp(Src) 98.3 F (36.8 C) (Oral)  Resp 18  SpO2 100% Physical Exam  Constitutional: She is oriented to person, place, and time. She appears well-developed and well-nourished. No distress.   HENT:  Head: Normocephalic and atraumatic.  Eyes: Conjunctivae and EOM are normal. Right eye exhibits no discharge. Left eye exhibits no discharge. No scleral icterus.  Neck: Normal range of motion. Neck supple. No tracheal deviation present.  Cardiovascular: Normal rate, regular rhythm and normal heart sounds.  Exam reveals no gallop and no friction rub.   No murmur heard. Pulmonary/Chest: Effort normal and breath sounds normal. No respiratory distress. She has no wheezes.  Abdominal: Soft. She exhibits no distension. There is no tenderness.  Musculoskeletal: Normal range of motion.  Right-sided lumbar paraspinal muscles tender to palpation, no bony tenderness, step-offs, or gross abnormality or deformity of spine, patient is able to ambulate, moves all extremities  Bilateral great toe extension intact Bilateral plantar/dorsiflexion intact  Neurological: She is alert and oriented to person, place, and time. She has normal reflexes.  Sensation and strength intact bilaterally Symmetrical reflexes  Skin: Skin is warm. She is not diaphoretic.  Psychiatric: She has a normal mood and affect. Her behavior is normal. Judgment and thought content normal.  Nursing note and vitals reviewed.   ED Course  Procedures (including critical care time) Labs Review Labs Reviewed - No data to display  Imaging Review No results found.   EKG Interpretation None      MDM   Final diagnoses:  Right-sided low back pain with right-sided sciatica    Patient with back pain.  No neurological deficits and normal neuro exam.  Patient is ambulatory.  No loss of bowel or bladder control.  Doubt cauda equina.  Denies fever,  doubt epidural abscess or other lesion. Recommend back exercises, stretching, RICE, and will treat with a short course of prednisone, percocet, and flexeril.  Encouraged the patient that there could be a need for additional workup and/or imaging such as MRI, if the symptoms do not  resolve. Patient advised that if the back pain does not resolve, or radiates, this could progress to more serious conditions and is encouraged to follow-up with PCP or orthopedics within 2 weeks.      Roxy Horseman, PA-C 12/17/14 1059  Vanetta Mulders, MD 12/17/14 1103

## 2014-12-17 NOTE — Discharge Instructions (Signed)
Back Pain, Adult °Back pain is very common. The pain often gets better over time. The cause of back pain is usually not dangerous. Most people can learn to manage their back pain on their own.  °HOME CARE  °· Stay active. Start with short walks on flat ground if you can. Try to walk farther each day. °· Do not sit, drive, or stand in one place for more than 30 minutes. Do not stay in bed. °· Do not avoid exercise or work. Activity can help your back heal faster. °· Be careful when you bend or lift an object. Bend at your knees, keep the object close to you, and do not twist. °· Sleep on a firm mattress. Lie on your side, and bend your knees. If you lie on your back, put a pillow under your knees. °· Only take medicines as told by your doctor. °· Put ice on the injured area. °· Put ice in a plastic bag. °· Place a towel between your skin and the bag. °· Leave the ice on for 15-20 minutes, 03-04 times a day for the first 2 to 3 days. After that, you can switch between ice and heat packs. °· Ask your doctor about back exercises or massage. °· Avoid feeling anxious or stressed. Find good ways to deal with stress, such as exercise. °GET HELP RIGHT AWAY IF:  °· Your pain does not go away with rest or medicine. °· Your pain does not go away in 1 week. °· You have new problems. °· You do not feel well. °· The pain spreads into your legs. °· You cannot control when you poop (bowel movement) or pee (urinate). °· Your arms or legs feel weak or lose feeling (numbness). °· You feel sick to your stomach (nauseous) or throw up (vomit). °· You have belly (abdominal) pain. °· You feel like you may pass out (faint). °MAKE SURE YOU:  °· Understand these instructions. °· Will watch your condition. °· Will get help right away if you are not doing well or get worse. °Document Released: 04/10/2008 Document Revised: 01/15/2012 Document Reviewed: 02/24/2014 °ExitCare® Patient Information ©2015 [employer]. This information is not intended  to replace advice given to you by your health care provider. Make sure you discuss any questions you have with your health care provider. °Sciatica °Sciatica is pain, weakness, numbness, or tingling along the path of the sciatic nerve. The nerve starts in the lower back and runs down the back of each leg. The nerve controls the muscles in the lower leg and in the back of the knee, while also providing sensation to the back of the thigh, lower leg, and the sole of your foot. Sciatica is a symptom of another medical condition. For instance, nerve damage or certain conditions, such as a herniated disk or bone spur on the spine, pinch or put pressure on the sciatic nerve. This causes the pain, weakness, or other sensations normally associated with sciatica. Generally, sciatica only affects one side of the body. °CAUSES  °· Herniated or slipped disc. °· Degenerative disk disease. °· A pain disorder involving the narrow muscle in the buttocks (piriformis syndrome). °· Pelvic injury or fracture. °· Pregnancy. °· Tumor (rare). °SYMPTOMS  °Symptoms can vary from mild to very severe. The symptoms usually travel from the low back to the buttocks and down the back of the leg. Symptoms can include: °· Mild tingling or dull aches in the lower back, leg, or hip. °· Numbness in the back   of the calf or sole of the foot. °· Burning sensations in the lower back, leg, or hip. °· Sharp pains in the lower back, leg, or hip. °· Leg weakness. °· Severe back pain inhibiting movement. °These symptoms may get worse with coughing, sneezing, laughing, or prolonged sitting or standing. Also, being overweight may worsen symptoms. °DIAGNOSIS  °Your caregiver will perform a physical exam to look for common symptoms of sciatica. He or she may ask you to do certain movements or activities that would trigger sciatic nerve pain. Other tests may be performed to find the cause of the sciatica. These may include: °· Blood tests. °· X-rays. °· Imaging  tests, such as an MRI or CT scan. °TREATMENT  °Treatment is directed at the cause of the sciatic pain. Sometimes, treatment is not necessary and the pain and discomfort goes away on its own. If treatment is needed, your caregiver may suggest: °· Over-the-counter medicines to relieve pain. °· Prescription medicines, such as anti-inflammatory medicine, muscle relaxants, or narcotics. °· Applying heat or ice to the painful area. °· Steroid injections to lessen pain, irritation, and inflammation around the nerve. °· Reducing activity during periods of pain. °· Exercising and stretching to strengthen your abdomen and improve flexibility of your spine. Your caregiver may suggest losing weight if the extra weight makes the back pain worse. °· Physical therapy. °· Surgery to eliminate what is pressing or pinching the nerve, such as a bone spur or part of a herniated disk. °HOME CARE INSTRUCTIONS  °· Only take over-the-counter or prescription medicines for pain or discomfort as directed by your caregiver. °· Apply ice to the affected area for 20 minutes, 3-4 times a day for the first 48-72 hours. Then try heat in the same way. °· Exercise, stretch, or perform your usual activities if these do not aggravate your pain. °· Attend physical therapy sessions as directed by your caregiver. °· Keep all follow-up appointments as directed by your caregiver. °· Do not wear high heels or shoes that do not provide proper support. °· Check your mattress to see if it is too soft. A firm mattress may lessen your pain and discomfort. °SEEK IMMEDIATE MEDICAL CARE IF:  °· You lose control of your bowel or bladder (incontinence). °· You have increasing weakness in the lower back, pelvis, buttocks, or legs. °· You have redness or swelling of your back. °· You have a burning sensation when you urinate. °· You have pain that gets worse when you lie down or awakens you at night. °· Your pain is worse than you have experienced in the past. °· Your  pain is lasting longer than 4 weeks. °· You are suddenly losing weight without reason. °MAKE SURE YOU: °· Understand these instructions. °· Will watch your condition. °· Will get help right away if you are not doing well or get worse. °Document Released: 10/17/2001 Document Revised: 04/23/2012 Document Reviewed: 03/03/2012 °ExitCare® Patient Information ©2015 [employer]. This information is not intended to replace advice given to you by your health care provider. Make sure you discuss any questions you have with your health care provider. ° °

## 2014-12-17 NOTE — ED Notes (Signed)
Per pt, states right lower back pain radiating sown right leg for a couple of days-no injury-thinks it is from lifting etc

## 2015-09-21 ENCOUNTER — Emergency Department (HOSPITAL_COMMUNITY)
Admission: EM | Admit: 2015-09-21 | Discharge: 2015-09-21 | Disposition: A | Discharge status: Home / Self Care | Payer: Medicaid Other | Attending: Emergency Medicine | Admitting: Emergency Medicine

## 2015-09-21 ENCOUNTER — Encounter (HOSPITAL_COMMUNITY): Payer: Self-pay

## 2015-09-21 DIAGNOSIS — G44209 Tension-type headache, unspecified, not intractable: Secondary | ICD-10-CM

## 2015-09-21 DIAGNOSIS — Z87891 Personal history of nicotine dependence: Secondary | ICD-10-CM | POA: Insufficient documentation

## 2015-09-21 DIAGNOSIS — Z7952 Long term (current) use of systemic steroids: Secondary | ICD-10-CM | POA: Insufficient documentation

## 2015-09-21 DIAGNOSIS — R202 Paresthesia of skin: Secondary | ICD-10-CM | POA: Insufficient documentation

## 2015-09-21 MED ORDER — BUTALBITAL-APAP-CAFFEINE 50-325-40 MG PO TABS: 1.0000 | ORAL_TABLET | Freq: Four times a day (QID) | ORAL | Status: AC | PRN

## 2015-09-21 MED ORDER — DIAZEPAM 5 MG PO TABS
10.0000 mg | ORAL_TABLET | Freq: Once | ORAL | Status: AC
  Administered 2015-09-21: 10 mg via ORAL
  Filled 2015-09-21: qty 2

## 2015-09-21 MED ORDER — MORPHINE SULFATE (PF) 4 MG/ML IV SOLN
4.0000 mg | Freq: Once | INTRAVENOUS | Status: AC
  Administered 2015-09-21: 4 mg via INTRAMUSCULAR
  Filled 2015-09-21: qty 1

## 2015-09-21 NOTE — ED Notes (Signed)
Pt c/o intermittent headache and intermittent R side facial tingling x 2 days.  Pt reports pain increases w/ shoulder and arm movement.  Pain score 9/10.  Denies injury.  Pt reports taking a "old muscle relaxer" w/o relief x 1 day ago.

## 2015-09-21 NOTE — ED Notes (Signed)
Pt alert and oriented x4. Respirations even and unlabored, bilateral symmetrical rise and fall of chest. Skin warm and dry. In no acute distress. Denies needs.   

## 2015-09-21 NOTE — ED Provider Notes (Signed)
CSN: [id number]     Arrival date & time 09/21/15  1244 History   First MD Initiated Contact with Patient 09/21/15 1408     Chief Complaint  Patient presents with  . Headache  . Facial Tingling      (Consider location/radiation/quality/duration/timing/severity/associated sxs/prior Treatment) HPI Comments: Patient here complaining of bilateral neck pain that radiates into her temples on both sides. Pain characterized as sharp and worse with movement of her shoulders or arms. Used a muscle relaxer without relief. No associated photophobia or phonophobia. No fever or chills. No vomiting. No neurological complaints of. Pain better with remaining still and worse with movement. No recent injury reported. No visual changes.  Patient is a 64 y.o. female presenting with headaches. The history is provided by the patient.  Headache   History reviewed. No pertinent past medical history. Past Surgical History  Procedure Laterality Date  . Cesarean section     History reviewed. No pertinent family history. Social History  Substance Use Topics  . Smoking status: Former Smoker    Types: Cigarettes  . Smokeless tobacco: None  . Alcohol Use: No   OB History    No data available     Review of Systems  Neurological: Positive for headaches.  All other systems reviewed and are negative.     Allergies  Review of patient's allergies indicates not on file.  Home Medications   Prior to Admission medications   Medication Sig Start Date End Date Taking? Authorizing Provider  cyclobenzaprine (FLEXERIL) 10 MG tablet Take 1 tablet (10 mg total) by mouth 2 (two) times daily as needed for muscle spasms. 12/17/14   Roxy Horsemanobert Browning, PA-C  oxyCODONE-acetaminophen (PERCOCET/ROXICET) 5-325 MG per tablet Take 1-2 tablets by mouth every 6 (six) hours as needed for severe pain. 12/17/14   Roxy Horsemanobert Browning, PA-C  predniSONE (DELTASONE) 20 MG tablet Take 2 tablets (40 mg total) by mouth daily. 12/17/14   Roxy Horsemanobert  Browning, PA-C   BP 138/85 mmHg  Pulse 85  Temp(Src) 98.3 F (36.8 C) (Oral)  Resp 14  SpO2 95% Physical Exam  Constitutional: She is oriented to person, place, and time. She appears well-developed and well-nourished.  Non-toxic appearance. No distress.  HENT:  Head: Normocephalic and atraumatic.  Eyes: Conjunctivae, EOM and lids are normal. Pupils are equal, round, and reactive to light.  Neck: Normal range of motion. Neck supple. No tracheal deviation present. No thyroid mass present.    Cardiovascular: Normal rate, regular rhythm and normal heart sounds.  Exam reveals no gallop.   No murmur heard. Pulmonary/Chest: Effort normal and breath sounds normal. No stridor. No respiratory distress. She has no decreased breath sounds. She has no wheezes. She has no rhonchi. She has no rales.  Abdominal: Soft. Normal appearance and bowel sounds are normal. She exhibits no distension. There is no tenderness. There is no rebound and no CVA tenderness.  Musculoskeletal: Normal range of motion. She exhibits no edema or tenderness.  Neurological: She is alert and oriented to person, place, and time. She has normal strength. No cranial nerve deficit or sensory deficit. GCS eye subscore is 4. GCS verbal subscore is 5. GCS motor subscore is 6.  Skin: Skin is warm and dry. No abrasion and no rash noted.  Psychiatric: She has a normal mood and affect. Her speech is normal and behavior is normal.  Nursing note and vitals reviewed.   ED Course  Procedures (including critical care time) Labs Review Labs Reviewed - No data to display  Imaging Review No results found. I have personally reviewed and evaluated these images and lab results as part of my medical decision-making.   EKG Interpretation   Date/Time:  Tuesday September 21 2015 12:56:42 EST Ventricular Rate:  95 PR Interval:  134 QRS Duration: 80 QT Interval:  333 QTC Calculation: 419 R Axis:   -32 Text Interpretation:  Sinus rhythm  Probable left atrial enlargement Left  axis deviation Low voltage, precordial leads Borderline T wave  abnormalities No significant change since last tracing Confirmed by Kriya Westra   MD, Marche Hottenstein ([id number]) on 09/21/2015 2:16:30 PM      MDM   Final diagnoses:  None    Patient given pain meds here feels better. Suspect this is musculoskeletal in etiology. Patient for discharge    Lorre Nick, MD 09/21/15 [phone]

## 2015-09-21 NOTE — ED Notes (Signed)
Pt escorted to discharge window. Pt verbalized understanding discharge instructions. In no acute distress.  

## 2015-09-21 NOTE — Discharge Instructions (Signed)
Tension Headache A tension headache is a feeling of pain, pressure, or aching that is often felt over the front and sides of the head. The pain can be dull, or it can feel tight (constricting). Tension headaches are not normally associated with nausea or vomiting, and they do not get worse with physical activity. Tension headaches can last from 30 minutes to several days. This is the most common type of headache. CAUSES The exact cause of this condition is not known. Tension headaches often begin after stress, anxiety, or depression. Other triggers may include:  Alcohol.  Too much caffeine, or caffeine withdrawal.  Respiratory infections, such as colds, flu, or sinus infections.  Dental problems or teeth clenching.  Fatigue.  Holding your head and neck in the same position for a long period of time, such as while using a computer.  Smoking. SYMPTOMS Symptoms of this condition include:  A feeling of pressure around the head.  Dull, aching head pain.  Pain felt over the front and sides of the head.  Tenderness in the muscles of the head, neck, and shoulders. DIAGNOSIS This condition may be diagnosed based on your symptoms and a physical exam. Tests may be done, such as a CT scan or an MRI of your head. These tests may be done if your symptoms are severe or unusual. TREATMENT This condition may be treated with lifestyle changes and medicines to help relieve symptoms. HOME CARE INSTRUCTIONS Managing Pain  Take over-the-counter and prescription medicines only as told by your health care provider.  Lie down in a dark, quiet room when you have a headache.  If directed, apply ice to the head and neck area:  Put ice in a plastic bag.  Place a towel between your skin and the bag.  Leave the ice on for 20 minutes, 2-3 times per day.  Use a heating pad or a hot shower to apply heat to the head and neck area as told by your health care provider. Eating and Drinking  Eat meals on  a regular schedule.  Limit alcohol use.  Decrease your caffeine intake, or stop using caffeine. General Instructions  Keep all follow-up visits as told by your health care provider. This is important.  Keep a headache journal to help find out what may trigger your headaches. For example, write down:  What you eat and drink.  How much sleep you get.  Any change to your diet or medicines.  Try massage or other relaxation techniques.  Limit stress.  Sit up straight, and avoid tensing your muscles.  Do not use tobacco products, including cigarettes, chewing tobacco, or e-cigarettes. If you need help quitting, ask your health care provider.  Exercise regularly as told by your health care provider.  Get 7-9 hours of sleep, or the amount recommended by your health care provider. SEEK MEDICAL CARE IF:  Your symptoms are not helped by medicine.  You have a headache that is different from what you normally experience.  You have nausea or you vomit.  You have a fever. SEEK IMMEDIATE MEDICAL CARE IF:  Your headache becomes severe.  You have repeated vomiting.  You have a stiff neck.  You have a loss of vision.  You have problems with speech.  You have pain in your eye or ear.  You have muscular weakness or loss of muscle control.  You lose your balance or you have trouble walking.  You feel faint or you pass out.  You have confusion.     This information is not intended to replace advice given to you by your health care provider. Make sure you discuss any questions you have with your health care provider.   Document Released: 10/23/2005 Document Revised: 07/14/2015 Document Reviewed: 02/15/2015 Elsevier Interactive Patient Education 2016 [employer]  

## 2016-04-22 ENCOUNTER — Emergency Department (HOSPITAL_COMMUNITY): Payer: Self-pay

## 2016-04-22 ENCOUNTER — Encounter (HOSPITAL_COMMUNITY): Payer: Self-pay | Admitting: *Deleted

## 2016-04-22 ENCOUNTER — Emergency Department (HOSPITAL_COMMUNITY)
Admission: EM | Admit: 2016-04-22 | Discharge: 2016-04-22 | Disposition: A | Discharge status: Home / Self Care | Payer: Self-pay | Attending: Emergency Medicine | Admitting: Emergency Medicine

## 2016-04-22 DIAGNOSIS — M25551 Pain in right hip: Secondary | ICD-10-CM | POA: Insufficient documentation

## 2016-04-22 DIAGNOSIS — Z87891 Personal history of nicotine dependence: Secondary | ICD-10-CM | POA: Insufficient documentation

## 2016-04-22 DIAGNOSIS — R05 Cough: Secondary | ICD-10-CM | POA: Insufficient documentation

## 2016-04-22 DIAGNOSIS — R059 Cough, unspecified: Secondary | ICD-10-CM

## 2016-04-22 MED ORDER — PREDNISONE 20 MG PO TABS: 40.0000 mg | ORAL_TABLET | Freq: Every day | ORAL | Status: DC

## 2016-04-22 MED ORDER — DIPHENHYDRAMINE HCL 25 MG PO CAPS: 50.0000 mg | ORAL_CAPSULE | Freq: Once | ORAL | Status: DC

## 2016-04-22 MED ORDER — KETOROLAC TROMETHAMINE 30 MG/ML IJ SOLN: 30.0000 mg | Freq: Once | INTRAMUSCULAR | Status: DC

## 2016-04-22 MED ORDER — METOCLOPRAMIDE HCL 10 MG PO TABS: 10.0000 mg | ORAL_TABLET | Freq: Once | ORAL | Status: DC

## 2016-04-22 MED ORDER — MELOXICAM 7.5 MG PO TABS: 15.0000 mg | ORAL_TABLET | Freq: Every day | ORAL | Status: DC

## 2016-04-22 MED ORDER — TRAMADOL HCL 50 MG PO TABS
50.0000 mg | ORAL_TABLET | Freq: Once | ORAL | Status: AC
  Administered 2016-04-22: 50 mg via ORAL
  Filled 2016-04-22: qty 1

## 2016-04-22 NOTE — Discharge Instructions (Signed)
There does not appear to be an emergent cause for your symptoms at this time. Your exam and x-rays are reassuring. Take your medications as prescribed. Follow-up with your doctor for reevaluation next week. Return to ED for new worsening symptoms.

## 2016-04-22 NOTE — ED Notes (Signed)
Pt complains of cough since Monday, pain in her right hip radiating down her leg since Thursday. Pt denies injury to right hip. Pt states she has hx of degenerative disc disease.

## 2016-04-22 NOTE — ED Provider Notes (Signed)
CSN: [id number]     Arrival date & time 04/22/16  1344 History  By signing my name below, I, Linna Darner, attest that this documentation has been prepared under the direction and in the presence of [employer], PA-C. Electronically Signed: Linna Darner, Scribe. 04/22/2016. 3:49 PM.    Chief Complaint  Patient presents with  . Cough  . Hip Pain    The history is provided by the patient. No language interpreter was used.     HPI Comments: Brittany Gomez is a 65 y.o. female who presents to the Emergency Department complaining of constant, gradually worsening, right hip pain for the last two days. Pt reports that she lifted her 30 lb grandchild 6 days ago. Pt denies known injury to her right hip or recent falls. She endorses significant right hip pain exacerbation with bearing weight on her right leg as well as with general right leg movement. Pt has not worn new shoes recently. Pt has tried Aleve and BC Powder for her right hip pain with no relief. Pt denies h/o IV drug use. She further denies numbness, neuro deficits, bowel/bladder incontinence, or any other associated symptoms. Pt states her new PCP is Dr. Pecola Leisure with [facility].  Pt additionally complains of sudden onset, constant, productive cough with green sputum for the last several days. Pt has no known allergies. She reports that her grandchild has been sick recently with similar symptoms. She denies fever, chills, or any other associated symptoms.  History reviewed. No pertinent past medical history. Past Surgical History  Procedure Laterality Date  . Cesarean section     No family history on file. Social History  Substance Use Topics  . Smoking status: Former Smoker    Types: Cigarettes  . Smokeless tobacco: None  . Alcohol Use: No   OB History    No data available     Review of Systems  A complete 10 system review of systems was obtained and all systems are negative except as noted in the HPI and PMH.    Allergies  Review of patient's allergies indicates no known allergies.  Home Medications   Prior to Admission medications   Medication Sig Start Date End Date Taking? Authorizing Provider  acetaminophen (TYLENOL) 325 MG tablet Take 650 mg by mouth every 6 (six) hours as needed for mild pain, moderate pain or headache.    Historical Provider, MD  butalbital-acetaminophen-caffeine (FIORICET) 50-325-40 MG tablet Take 1-2 tablets by mouth every 6 (six) hours as needed for headache. 09/21/15 09/20/16  Lorre Nick, MD  cyclobenzaprine (FLEXERIL) 10 MG tablet Take 1 tablet (10 mg total) by mouth 2 (two) times daily as needed for muscle spasms. Patient not taking: Reported on 09/21/2015 12/17/14   Roxy Horseman, PA-C  Flaxseed, Linseed, (FLAX SEED OIL PO) Take 5 mLs by mouth daily.    Historical Provider, MD  meloxicam (MOBIC) 7.5 MG tablet Take 2 tablets (15 mg total) by mouth daily. 04/22/16   Joycie Peek, PA-C  predniSONE (DELTASONE) 20 MG tablet Take 2 tablets (40 mg total) by mouth daily. 04/22/16   Joycie Peek, PA-C  vitamin B-12 (CYANOCOBALAMIN) 1000 MCG tablet Take 1,000 mcg by mouth daily.    Historical Provider, MD   BP 113/83 mmHg  Pulse 103  Temp(Src) 99.3 F (37.4 C) (Oral)  Resp 18  SpO2 94% Physical Exam  Constitutional: She is oriented to person, place, and time. She appears well-developed and well-nourished. No distress.  HENT:  Head: Normocephalic and atraumatic.  Eyes: Conjunctivae and EOM are normal.  Neck: Neck supple. No tracheal deviation present.  Cardiovascular: Normal rate.   Pulmonary/Chest: Effort normal. No respiratory distress.  Musculoskeletal: Normal range of motion.  Right hip: Slightly decreased range of motion secondary to pain only. Moderate tenderness to palpation on the lateral aspect of right hip over proximal femur with no deformities or abnormalities noted. Muscle compartments are soft. No unilateral leg swelling, redness. Motor strength,  sensation are baseline. Distal pulses intact. Gait is somewhat antalgic but no ataxia  Neurological: She is alert and oriented to person, place, and time.  Skin: Skin is warm and dry.  Psychiatric: She has a normal mood and affect. Her behavior is normal.  Nursing note and vitals reviewed.  ED Course  Procedures (including critical care time)  DIAGNOSTIC STUDIES: Oxygen Saturation is 94% on RA, adequate by my interpretation.    COORDINATION OF CARE: 3:49 PM Discussed treatment plan with pt at bedside and pt agreed to plan.  Labs Review Labs Reviewed - No data to display  Imaging Review Dg Chest 2 View  04/22/2016  CLINICAL DATA:  Productive cough. EXAM: CHEST  2 VIEW COMPARISON:  Radiograph of July 24, 2009. FINDINGS: The heart size and mediastinal contours are within normal limits. Both lungs are clear. No pneumothorax or pleural effusion is noted. The visualized skeletal structures are unremarkable. IMPRESSION: No active cardiopulmonary disease. Electronically Signed   By: Lupita RaiderJames  Green Jr, M.D.   On: 04/22/2016 16:24   Dg Hip Unilat With Pelvis 2-3 Views Right  04/22/2016  CLINICAL DATA:  Acute right hip pain without known injury. EXAM: DG HIP (WITH OR WITHOUT PELVIS) 2-3V RIGHT COMPARISON:  None. FINDINGS: There is no evidence of hip fracture or dislocation. There is no evidence of arthropathy or other focal bone abnormality. IMPRESSION: Normal right hip. Electronically Signed   By: Lupita RaiderJames  Green Jr, M.D.   On: 04/22/2016 16:26   I have personally reviewed and evaluated these images and lab results as part of my medical decision-making.   EKG Interpretation None      MDM  Presents with complaints of cough and right hip pain. Clinical presentation physical exam consistent with musculoskeletal etiology for hip pain. Benign cardio pulmonary exam with negative chest x-ray. Low suspicion for pneumonia or other emergent cardio pulmonary pathology. Plain films of right hip are  negative. Will initiate short course steroid and NSAID trial. Discussed follow-up with PCP. She verbalizes understanding, agrees with this plan and subsequent discharge. Final diagnoses:  Cough  Right hip pain    I personally performed the services described in this documentation, which was scribed in my presence. The recorded information has been reviewed and is accurate.   Joycie PeekBenjamin Mihir Flanigan, PA-C 04/22/16 1655  Gwyneth SproutWhitney Plunkett, MD 04/22/16 2149

## 2017-12-31 ENCOUNTER — Other Ambulatory Visit: Payer: Self-pay

## 2018-01-02 LAB — CYTOLOGY - PAP: Diagnosis: NEGATIVE

## 2018-04-02 ENCOUNTER — Other Ambulatory Visit: Payer: Self-pay

## 2018-04-02 ENCOUNTER — Encounter: Payer: Self-pay | Admitting: Family Medicine

## 2018-04-02 ENCOUNTER — Ambulatory Visit (INDEPENDENT_AMBULATORY_CARE_PROVIDER_SITE_OTHER): Payer: Medicare HMO | Admitting: Family Medicine

## 2018-04-02 VITALS — BP 120/80 | HR 95 | Temp 98.7°F | Ht 66.93 in | Wt 159.4 lb

## 2018-04-02 DIAGNOSIS — Z1211 Encounter for screening for malignant neoplasm of colon: Secondary | ICD-10-CM | POA: Diagnosis not present

## 2018-04-02 DIAGNOSIS — Z1231 Encounter for screening mammogram for malignant neoplasm of breast: Secondary | ICD-10-CM

## 2018-04-02 DIAGNOSIS — W57XXXA Bitten or stung by nonvenomous insect and other nonvenomous arthropods, initial encounter: Secondary | ICD-10-CM

## 2018-04-02 DIAGNOSIS — S30860A Insect bite (nonvenomous) of lower back and pelvis, initial encounter: Secondary | ICD-10-CM | POA: Diagnosis not present

## 2018-04-02 MED ORDER — DOXYCYCLINE HYCLATE 100 MG PO TABS
100.0000 mg | ORAL_TABLET | Freq: Two times a day (BID) | ORAL | 0 refills | Status: DC
Start: 2018-04-02 — End: 2018-06-20

## 2018-04-02 NOTE — Patient Instructions (Addendum)
   IF you received an x-ray today, you will receive an invoice from [facility]. Please contact [facility] at [phone] with questions or concerns regarding your invoice.   IF you received labwork today, you will receive an invoice from [facility]. Please contact [facility] at [phone] with questions or concerns regarding your invoice.   Our billing staff will not be able to assist you with questions regarding bills from these companies.  You will be contacted with the lab results as soon as they are available. The fastest way to get your results is to activate your My Chart account. Instructions are located on the last page of this paperwork. If you have not heard from us regarding the results in 2 weeks, please contact this office.     Tick Bite Information, Adult Ticks are insects that can bite. Most ticks live in shrubs and grassy areas. They climb onto people and animals that go by. Then they bite. Some ticks carry germs that can make you sick. How can I prevent tick bites?  Use an insect repellent that has 20% or higher of the ingredients DEET, picaridin, or IR3535. Put this insect repellent on: ? Bare skin. ? The tops of your boots. ? Your pant legs. ? The ends of your sleeves.  If you use an insect repellent that has the ingredient permethrin, make sure to follow the instructions on the bottle. Treat the following: ? Clothing. ? Supplies. ? Boots. ? Tents.  Wear long sleeves, long pants, and light colors.  Tuck your pant legs into your socks.  Stay in the middle of the trail.  Try not to walk through long grass.  Before going inside your house, check your clothes, hair, and skin for ticks. Make sure to check your head, neck, armpits, waist, groin, and joint areas.  Check for ticks every day.  When you come indoors: ? Wash your clothes right away. ? Shower right away. ? Dry your clothes in a dryer on high heat for 60 minutes or more. What is  the right way to remove a tick? Remove a tick from your skin as soon as possible.  To remove a tick that is crawling on your skin: ? Go outdoors and brush the tick off. ? Use tape or a lint roller.  To remove a tick that is biting: ? Wash your hands. ? If you have latex gloves, put them on. ? Use tweezers, curved forceps, or a tick-removal tool to grasp the tick. Grasp the tick as close to your skin and as close to the tick's head as possible. ? Gently pull up until the tick lets go.  Try to keep the tick's head attached to its body.  Do not twist or jerk the tick.  Do not squeeze or crush the tick.  Do not try to remove a tick with heat, alcohol, petroleum jelly, or fingernail polish. How should I get rid of a tick? Here are some ways to get rid of a tick that is alive:  Place the tick in rubbing alcohol.  Place the tick in a bag or container you can close tightly.  Wrap the tick tightly in tape.  Flush the tick down the toilet.  Contact a doctor if:  You have symptoms of a disease, such as: ? Pain in a muscle, joint, or bone. ? Trouble walking or moving your legs. ? Numbness in your legs. ? Inability to move (paralysis). ? A red rash that makes a circle (  bull's-eye rash). ? Redness and swelling where the tick bit you. ? A fever. ? Throwing up (vomiting) over and over. ? Diarrhea. ? Weight loss. ? Tender and swollen lymph glands. ? Shortness of breath. ? Cough. ? Belly pain (abdominal pain). ? Headache. ? Being more tired than normal. ? A change in how alert (conscious) you are. ? Confusion. Get help right away if:  You cannot remove a tick.  A part of a tick breaks off and gets stuck in your skin.  You are feeling worse. Summary  Ticks may carry germs that can make you sick.  To prevent tick bites, wear long sleeves, long pants, and light colors. Use insect repellent. Follow the instructions on the bottle.  If the tick is biting, do not try to remove  it with heat, alcohol, petroleum jelly, or fingernail polish.  Use tweezers, curved forceps, or a tick-removal tool to grasp the tick. Gently pull up until the tick lets go. Do not twist or jerk the tick. Do not squeeze or crush the tick.  If you have symptoms, contact a doctor. This information is not intended to replace advice given to you by your health care provider. Make sure you discuss any questions you have with your health care provider. Document Released: 01/17/2010 Document Revised: 02/02/2017 Document Reviewed: 02/02/2017 Elsevier Interactive Patient Education  2018 [employer] Inc[employer]  

## 2018-04-02 NOTE — Progress Notes (Signed)
5/28/20193:28 PM  Brittany Gomez 19-Apr-1951, 67 y.o. female [id number]  Chief Complaint  Patient presents with  . Tick Removal    occured last night  . Referral    mammagram and coloscopy    HPI:   Patient is a 67 y.o. female who presents today for tick bite  Headache, nausea and vomiting for past 3 days Denies any fever, chills, rashes Does not know when tick could have become attached, she has been working in her garden for most of the past week Tick found and removed last night Brought in tick, not fully removed, seems to be a dog tick  She is also requesting referral for mammogram and colonoscopy Last mammogram in 2012 Has never undergone colon cancer screening   Fall Risk  04/02/2018  Falls in the past year? No     Depression screen PHQ 2/9 04/02/2018  Decreased Interest 0  Down, Depressed, Hopeless 0  PHQ - 2 Score 0    No Known Allergies  Prior to Admission medications   Medication Sig Start Date End Date Taking? Authorizing Provider  acetaminophen (TYLENOL) 325 MG tablet Take 650 mg by mouth every 6 (six) hours as needed for mild pain, moderate pain or headache.   Yes [provider]  cyclobenzaprine (FLEXERIL) 10 MG tablet Take 1 tablet (10 mg total) by mouth 2 (two) times daily as needed for muscle spasms. 12/17/14  Yes Roxy Horseman, PA-C  Flaxseed, Linseed, (FLAX SEED OIL PO) Take 5 mLs by mouth daily.   Yes [provider]  meloxicam (MOBIC) 7.5 MG tablet Take 2 tablets (15 mg total) by mouth daily. 04/22/16  Yes Cartner, Sharlet Salina, PA-C  vitamin B-12 (CYANOCOBALAMIN) 1000 MCG tablet Take 1,000 mcg by mouth daily.   Yes [provider]    Past Medical History:  Diagnosis Date  . Allergy   . Anxiety   . Arthritis     Past Surgical History:  Procedure Laterality Date  . CESAREAN SECTION      Social History   Tobacco Use  . Smoking status: Former Smoker    Types: Cigarettes  . Smokeless tobacco: Never Used    Substance Use Topics  . Alcohol use: No    Family History  Problem Relation Age of Onset  . Diabetes Mother   . Stroke Father   . Cancer Sister   . Hypertension Sister     ROS Per hpi  OBJECTIVE:  Blood pressure 120/80, pulse 95, temperature 98.7 F (37.1 C), temperature source Oral, height 5' 6.93" (1.7 m), weight 159 lb 6.4 oz (72.3 kg), SpO2 95 %.  Physical Exam  Constitutional: She is oriented to person, place, and time. She appears well-developed and well-nourished.  HENT:  Head: Normocephalic and atraumatic.  Mouth/Throat: Mucous membranes are normal.  Eyes: Pupils are equal, round, and reactive to light. Conjunctivae and EOM are normal. No scleral icterus.  Neck: Neck supple.  Pulmonary/Chest: Effort normal.  Neurological: She is alert and oriented to person, place, and time.  Skin: Skin is warm and dry. No rash noted.  Small tick head removed from middle of upper back  Psychiatric: She has a normal mood and affect.  Nursing note and vitals reviewed.    ASSESSMENT and PLAN  1. Tick bite of back, initial encounter Given presence of vague constitutional symptoms, will start abx treatment. RTC precautions given. Patient educational handout given. - doxycycline (VIBRA-TABS) 100 MG tablet; Take 1 tablet (100 mg total) by mouth 2 (two)  times daily.  2. Visit for screening mammogram - MM DIGITAL SCREENING BILATERAL; Future  3. Screening for colon cancer - Ambulatory referral to Gastroenterology  Other orders   Return if symptoms worsen or fail to improve.    Myles Lipps, MD Primary Care at [facility] [address] [place], [place] [address] Ph.  [phone] Fax [phone]

## 2018-05-03 ENCOUNTER — Encounter: Payer: Self-pay | Admitting: Internal Medicine

## 2018-05-20 DIAGNOSIS — Z1239 Encounter for other screening for malignant neoplasm of breast: Secondary | ICD-10-CM | POA: Diagnosis not present

## 2018-05-20 DIAGNOSIS — F419 Anxiety disorder, unspecified: Secondary | ICD-10-CM | POA: Diagnosis not present

## 2018-05-20 DIAGNOSIS — Z0000 Encounter for general adult medical examination without abnormal findings: Secondary | ICD-10-CM | POA: Diagnosis not present

## 2018-05-20 DIAGNOSIS — Z136 Encounter for screening for cardiovascular disorders: Secondary | ICD-10-CM | POA: Diagnosis not present

## 2018-05-20 DIAGNOSIS — F5101 Primary insomnia: Secondary | ICD-10-CM | POA: Diagnosis not present

## 2018-05-20 DIAGNOSIS — Z83438 Family history of other disorder of lipoprotein metabolism and other lipidemia: Secondary | ICD-10-CM | POA: Diagnosis not present

## 2018-05-20 DIAGNOSIS — Z1159 Encounter for screening for other viral diseases: Secondary | ICD-10-CM | POA: Diagnosis not present

## 2018-05-20 DIAGNOSIS — E2839 Other primary ovarian failure: Secondary | ICD-10-CM | POA: Diagnosis not present

## 2018-05-20 DIAGNOSIS — R0602 Shortness of breath: Secondary | ICD-10-CM | POA: Diagnosis not present

## 2018-05-23 ENCOUNTER — Other Ambulatory Visit: Payer: Self-pay | Admitting: Family Medicine

## 2018-05-23 ENCOUNTER — Ambulatory Visit
Admission: RE | Admit: 2018-05-23 | Discharge: 2018-05-23 | Disposition: A | Discharge status: Home / Self Care | Payer: Medicare HMO | Source: Ambulatory Visit | Attending: Family Medicine | Admitting: Family Medicine

## 2018-05-23 DIAGNOSIS — R0602 Shortness of breath: Secondary | ICD-10-CM | POA: Diagnosis not present

## 2018-06-20 ENCOUNTER — Ambulatory Visit (INDEPENDENT_AMBULATORY_CARE_PROVIDER_SITE_OTHER): Payer: Medicare HMO | Admitting: Physician Assistant

## 2018-06-20 VITALS — BP 113/74 | HR 89 | Temp 98.3°F | Resp 16 | Ht 66.0 in | Wt 155.0 lb

## 2018-06-20 DIAGNOSIS — M7061 Trochanteric bursitis, right hip: Secondary | ICD-10-CM

## 2018-06-20 DIAGNOSIS — M25551 Pain in right hip: Secondary | ICD-10-CM

## 2018-06-20 NOTE — Progress Notes (Signed)
06/20/2018 2:22 PM   DOB: 1951-01-08 / MRN: [id number]  SUBJECTIVE:  Brittany Gomez is a 67 y.o. female presenting for intermittent right hip pain present for years. Worse over the last week and now has difficulty with lying or even touching the area. She has tried heat and ice, Ibuprofen and tylenol without significant relief.   She has No Known Allergies.   She  has a past medical history of Allergy, Anxiety, and Arthritis.    She  reports that she has quit smoking. Her smoking use included cigarettes. She has never used smokeless tobacco. She reports that she does not drink alcohol or use drugs. She  reports that she does not currently engage in sexual activity. The patient  has a past surgical history that includes Cesarean section.  Her family history includes Cancer in her sister; Diabetes in her mother; Hypertension in her sister; Stroke in her father.  Review of Systems  Constitutional: Negative for chills, diaphoresis and fever.  Eyes: Negative.   Respiratory: Negative for cough, hemoptysis, sputum production, shortness of breath and wheezing.   Cardiovascular: Negative for chest pain, orthopnea and leg swelling.  Gastrointestinal: Negative for nausea.  Skin: Negative for rash.  Neurological: Negative for dizziness, sensory change, speech change, focal weakness and headaches.    The problem list and medications were reviewed and updated by myself where necessary and exist elsewhere in the encounter.   OBJECTIVE:  BP 113/74   Pulse 89   Temp 98.3 F (36.8 C) (Oral)   Resp 16   Ht 5\' 6"  (1.676 m)   Wt 155 lb (70.3 kg)   SpO2 95%   BMI 25.02 kg/m   Wt Readings from Last 3 Encounters:  06/20/18 155 lb (70.3 kg)  04/02/18 159 lb 6.4 oz (72.3 kg)   Temp Readings from Last 3 Encounters:  06/20/18 98.3 F (36.8 C) (Oral)  04/02/18 98.7 F (37.1 C) (Oral)  04/22/16 99.3 F (37.4 C) (Oral)   BP Readings from Last 3 Encounters:  06/20/18 113/74  04/02/18 120/80    04/22/16 113/83   Pulse Readings from Last 3 Encounters:  06/20/18 89  04/02/18 95  04/22/16 80    Physical Exam  Constitutional: She is oriented to person, place, and time. She appears well-nourished. No distress.  Eyes: Pupils are equal, round, and reactive to light. EOM are normal.  Cardiovascular: Normal rate.  Pulmonary/Chest: Effort normal.  Abdominal: She exhibits no distension.  Musculoskeletal: She exhibits tenderness (borderline exquisite TTP of the right trochanteric bursa.  Hip exam otherwise normal. ).  Neurological: She is alert and oriented to person, place, and time. No cranial nerve deficit. Gait normal.  Skin: Skin is dry. She is not diaphoretic.  Psychiatric: She has a normal mood and affect.  Vitals reviewed.   No results found for: HGBA1C  Lab Results  Component Value Date   WBC 16.6 (H) 07/15/2011   HGB 13.2 07/15/2011   HCT 40.3 07/15/2011   MCV 92.9 07/15/2011   PLT 231 07/15/2011    Lab Results  Component Value Date   CREATININE 0.77 07/15/2011   BUN 8 07/15/2011   NA 137 07/15/2011   K 4.0 07/15/2011   CL 101 07/15/2011   CO2 29 07/15/2011    Lab Results  Component Value Date   ALT 7 07/15/2011   AST 13 07/15/2011   ALKPHOS 71 07/15/2011   BILITOT 0.4 07/15/2011    Lab Results  Component Value Date  TSH 0.714 Test methodology is 3rd generation TSH 04/12/2007    Lab Results  Component Value Date   CHOL (H) 04/12/2007    216        ATP III CLASSIFICATION:  <200     mg/dL   Desirable  161-096200-239  mg/dL   Borderline High  >=045>=240    mg/dL   High   HDL 45 40/98/119106/04/2007   LDLCALC (H) 04/12/2007    154        Total Cholesterol/HDL:CHD Risk Coronary Heart Disease Risk Table                     Men   Women  1/2 Average Risk   3.4   3.3   TRIG 84 04/12/2007   CHOLHDL 4.8 04/12/2007   Procedure: Sterile prep with betadine time x3.  Ethyle Chloride spray for topical anesthesia.  Trochanteric bursa injected using a superiolateral  approach with 2:1 mix marcaine/triamcinolone. Patient with drastic reduction in pain symptoms and pain to palpation post procedure.  Bandage placed.   ASSESSMENT AND PLAN:  Luster LandsbergRenee was seen today for hip pain.  Diagnoses and all orders for this visit:  Right hip pain: 2/2 problem two.  See procedure. Patient greatfull for pain relief.   Trochanteric bursitis of right hip    The patient is advised to call or return to clinic if she does not see an improvement in symptoms, or to seek the care of the closest emergency department if she worsens with the above plan.   Deliah BostonMichael Ambrosio Reuter, MHS, PA-C Primary Care at [facility] 06/20/2018 2:22 PM

## 2018-06-20 NOTE — Patient Instructions (Addendum)
If chills, fever, rash at the injection site then please return to clinic.  Otherwise please follow up with your PCP.    I will contact you with your lab results within the next 2 weeks.  If you have not heard from us then please contact us. The fastest way to get your results is to register for My Chart.   IF you received an x-ray today, you will receive an invoice from [facility]. Please contact [facility] at [phone] with questions or concerns regarding your invoice.   IF you received labwork today, you will receive an invoice from [facility]. Please contact [facility] at [phone] with questions or concerns regarding your invoice.   Our billing staff will not be able to assist you with questions regarding bills from these companies.  You will be contacted with the lab results as soon as they are available. The fastest way to get your results is to activate your My Chart account. Instructions are located on the last page of this paperwork. If you have not heard from us regarding the results in 2 weeks, please contact this office.

## 2018-06-28 ENCOUNTER — Telehealth: Payer: Self-pay | Admitting: *Deleted

## 2018-06-28 NOTE — Telephone Encounter (Signed)
Patient no show PV today, she had cancelled via phone tree. Called patient, no answer, left message for patient to call us back today by 5 pm to reschedule the pre-visit or the colonoscopy would be cancelled also.

## 2018-07-01 ENCOUNTER — Ambulatory Visit
Admission: RE | Admit: 2018-07-01 | Discharge: 2018-07-01 | Disposition: A | Discharge status: Home / Self Care | Payer: Medicare HMO | Source: Ambulatory Visit | Attending: Family Medicine | Admitting: Family Medicine

## 2018-07-01 DIAGNOSIS — Z1231 Encounter for screening mammogram for malignant neoplasm of breast: Secondary | ICD-10-CM | POA: Diagnosis not present

## 2018-07-04 ENCOUNTER — Encounter: Payer: Self-pay | Admitting: Family Medicine

## 2018-07-04 ENCOUNTER — Other Ambulatory Visit: Payer: Self-pay

## 2018-07-04 ENCOUNTER — Ambulatory Visit (INDEPENDENT_AMBULATORY_CARE_PROVIDER_SITE_OTHER): Payer: Medicare HMO | Admitting: Family Medicine

## 2018-07-04 VITALS — BP 122/80 | HR 78 | Temp 97.8°F | Ht 66.5 in | Wt 154.8 lb

## 2018-07-04 DIAGNOSIS — S30860A Insect bite (nonvenomous) of lower back and pelvis, initial encounter: Secondary | ICD-10-CM | POA: Diagnosis not present

## 2018-07-04 DIAGNOSIS — M79652 Pain in left thigh: Secondary | ICD-10-CM

## 2018-07-04 DIAGNOSIS — L03116 Cellulitis of left lower limb: Secondary | ICD-10-CM

## 2018-07-04 DIAGNOSIS — W57XXXA Bitten or stung by nonvenomous insect and other nonvenomous arthropods, initial encounter: Secondary | ICD-10-CM | POA: Diagnosis not present

## 2018-07-04 MED ORDER — DOXYCYCLINE HYCLATE 100 MG PO TABS: 100.0000 mg | ORAL_TABLET | Freq: Two times a day (BID) | ORAL | 0 refills | Status: DC

## 2018-07-04 NOTE — Patient Instructions (Addendum)
If you have lab work done today you will be contacted with your lab results within the next 2 weeks.  If you have not heard from us then please contact us. The fastest way to get your results is to register for My Chart.   IF you received an x-ray today, you will receive an invoice from [facility]. Please contact [facility] at [phone] with questions or concerns regarding your invoice.   IF you received labwork today, you will receive an invoice from [facility]. Please contact [facility] at [phone] with questions or concerns regarding your invoice.   Our billing staff will not be able to assist you with questions regarding bills from these companies.  You will be contacted with the lab results as soon as they are available. The fastest way to get your results is to activate your My Chart account. Instructions are located on the last page of this paperwork. If you have not heard from us regarding the results in 2 weeks, please contact this office.     Tick Bite Information, Adult Ticks are insects that can bite. Most ticks live in shrubs and grassy areas. They climb onto people and animals that go by. Then they bite. Some ticks carry germs that can make you sick. How can I prevent tick bites?  Use an insect repellent that has 20% or higher of the ingredients DEET, picaridin, or IR3535. Put this insect repellent on: ? Bare skin. ? The tops of your boots. ? Your pant legs. ? The ends of your sleeves.  If you use an insect repellent that has the ingredient permethrin, make sure to follow the instructions on the bottle. Treat the following: ? Clothing. ? Supplies. ? Boots. ? Tents.  Wear long sleeves, long pants, and light colors.  Tuck your pant legs into your socks.  Stay in the middle of the trail.  Try not to walk through long grass.  Before going inside your house, check your clothes, hair, and skin for ticks. Make sure to check your head, neck,  armpits, waist, groin, and joint areas.  Check for ticks every day.  When you come indoors: ? Wash your clothes right away. ? Shower right away. ? Dry your clothes in a dryer on high heat for 60 minutes or more. What is the right way to remove a tick? Remove a tick from your skin as soon as possible.  To remove a tick that is crawling on your skin: ? Go outdoors and brush the tick off. ? Use tape or a lint roller.  To remove a tick that is biting: ? Wash your hands. ? If you have latex gloves, put them on. ? Use tweezers, curved forceps, or a tick-removal tool to grasp the tick. Grasp the tick as close to your skin and as close to the tick's head as possible. ? Gently pull up until the tick lets go.  Try to keep the tick's head attached to its body.  Do not twist or jerk the tick.  Do not squeeze or crush the tick.  Do not try to remove a tick with heat, alcohol, petroleum jelly, or fingernail polish. How should I get rid of a tick? Here are some ways to get rid of a tick that is alive:  Place the tick in rubbing alcohol.  Place the tick in a bag or container you can close tightly.  Wrap the tick tightly in tape.  Flush the tick down the toilet.  Contact a doctor if:  You have symptoms of a disease, such as: ? Pain in a muscle, joint, or bone. ? Trouble walking or moving your legs. ? Numbness in your legs. ? Inability to move (paralysis). ? A red rash that makes a circle (bull's-eye rash). ? Redness and swelling where the tick bit you. ? A fever. ? Throwing up (vomiting) over and over. ? Diarrhea. ? Weight loss. ? Tender and swollen lymph glands. ? Shortness of breath. ? Cough. ? Belly pain (abdominal pain). ? Headache. ? Being more tired than normal. ? A change in how alert (conscious) you are. ? Confusion. Get help right away if:  You cannot remove a tick.  A part of a tick breaks off and gets stuck in your skin.  You are feeling  worse. Summary  Ticks may carry germs that can make you sick.  To prevent tick bites, wear long sleeves, long pants, and light colors. Use insect repellent. Follow the instructions on the bottle.  If the tick is biting, do not try to remove it with heat, alcohol, petroleum jelly, or fingernail polish.  Use tweezers, curved forceps, or a tick-removal tool to grasp the tick. Gently pull up until the tick lets go. Do not twist or jerk the tick. Do not squeeze or crush the tick.  If you have symptoms, contact a doctor. This information is not intended to replace advice given to you by your health care provider. Make sure you discuss any questions you have with your health care provider. Document Released: 01/17/2010 Document Revised: 02/02/2017 Document Reviewed: 02/02/2017 Elsevier Interactive Patient Education  2018 [employer].

## 2018-07-04 NOTE — Progress Notes (Signed)
   8/29/201912:44 PM  Brittany Gomez Sep 18, 1951, 67 y.o. female [id number]  Chief Complaint  Patient presents with  . Tick Removal    noticed 1 wk ago, starting to itch. Husband tried to remove    HPI:   Patient is a 67 y.o. female who presents today for tick bite  Lives where there are lots of trees and ticks Had tick bite a week ago on left inner thigh, unsure who long it has been there Removed it, still irritated, skin got warm and red No fever, chills Some body ache but thinks she it is related to weather change No nausea, vomiting, dizziness, confusion Feeling very tired  Fall Risk  07/04/2018 06/20/2018 04/02/2018  Falls in the past year? No No No     [facility] 07/04/2018 06/20/2018 04/02/2018  Decreased Interest 0 0 0  Down, Depressed, Hopeless 0 0 0  PHQ - 2 Score 0 0 0    No Known Allergies  Prior to Admission medications   Medication Sig Start Date End Date Taking? Authorizing Provider  acetaminophen (TYLENOL) 325 MG tablet Take 650 mg by mouth every 6 (six) hours as needed for mild pain, moderate pain or headache.    [provider]    Past Medical History:  Diagnosis Date  . Allergy   . Anxiety   . Arthritis     Past Surgical History:  Procedure Laterality Date  . CESAREAN SECTION      Social History   Tobacco Use  . Smoking status: Former Smoker    Types: Cigarettes  . Smokeless tobacco: Never Used  Substance Use Topics  . Alcohol use: No    Family History  Problem Relation Age of Onset  . Diabetes Mother   . Stroke Father   . Cancer Sister   . Hypertension Sister   . Breast cancer Neg Hx     ROS Per hpi  OBJECTIVE:  Blood pressure 122/80, pulse 78, temperature 97.8 F (36.6 C), temperature source Oral, height 5' 6.5" (1.689 m), weight 154 lb 12.8 oz (70.2 kg), SpO2 92 %. Body mass index is 24.61 kg/m.   Physical Exam  Constitutional: She is oriented to person, place, and time. She appears  well-developed and well-nourished.  HENT:  Head: Normocephalic and atraumatic.  Mouth/Throat: Mucous membranes are normal.  Eyes: Pupils are equal, round, and reactive to light. Conjunctivae and EOM are normal. No scleral icterus.  Neck: Neck supple.  Pulmonary/Chest: Effort normal.  Neurological: She is alert and oriented to person, place, and time.  Skin: Skin is warm and dry.  Left inner thigh with about 2 inches of erythema, mild swelling and warmth with central 3mm shallow ulceration  Psychiatric: She has a normal mood and affect.  Nursing note and vitals reviewed.    ASSESSMENT and PLAN  1. Left thigh pain 2. Cellulitis of left lower extremity 3. Tick bite of back, initial encounter - Lyme Ab/Western Blot Reflex - Rickettsial Fever Group IgG/M  Discussed supportive measures, new meds r/se/b and RTC precautions. Patient educational handout given.  Other orders - doxycycline (VIBRA-TABS) 100 MG tablet; Take 1 tablet (100 mg total) by mouth 2 (two) times daily.  Return if symptoms worsen or fail to improve.    [name], MD [facility] at [facility] [address] [place], [place] [address] Ph.  [phone] Fax [phone]

## 2018-07-06 LAB — RICKETTSIAL FEVER GROUP IGG/M
Spotted Fever Group IgG: <1:64 {titer}
Spotted Fever Group IgM: <1:64 {titer}
Typhus Fever Group IgG: <1:64 {titer}
Typhus Fever Group IgM: <1:64 {titer}

## 2018-07-06 LAB — LYME AB/WESTERN BLOT REFLEX
LYME DISEASE AB, QUANT, IGM: <0.8 index (ref 0.00–0.79)
Lyme IgG/IgM Ab: <0.91 {ISR} (ref 0.00–0.90)

## 2018-07-10 ENCOUNTER — Encounter: Payer: Self-pay | Admitting: *Deleted

## 2018-07-11 ENCOUNTER — Encounter: Payer: Self-pay | Admitting: Internal Medicine

## 2018-07-12 ENCOUNTER — Encounter: Payer: Self-pay | Admitting: Internal Medicine

## 2018-08-08 ENCOUNTER — Encounter: Payer: Self-pay | Admitting: Emergency Medicine

## 2018-08-08 ENCOUNTER — Ambulatory Visit (INDEPENDENT_AMBULATORY_CARE_PROVIDER_SITE_OTHER): Payer: Medicare HMO | Admitting: Emergency Medicine

## 2018-08-08 VITALS — BP 132/84 | HR 86 | Temp 98.4°F | Resp 16 | Ht 66.5 in | Wt 155.0 lb

## 2018-08-08 DIAGNOSIS — F418 Other specified anxiety disorders: Secondary | ICD-10-CM

## 2018-08-08 DIAGNOSIS — G4700 Insomnia, unspecified: Secondary | ICD-10-CM

## 2018-08-08 DIAGNOSIS — R252 Cramp and spasm: Secondary | ICD-10-CM

## 2018-08-08 DIAGNOSIS — B372 Candidiasis of skin and nail: Secondary | ICD-10-CM | POA: Diagnosis not present

## 2018-08-08 MED ORDER — HYDROXYZINE PAMOATE 50 MG PO CAPS: 50.0000 mg | ORAL_CAPSULE | Freq: Every evening | ORAL | 0 refills | Status: DC | PRN

## 2018-08-08 MED ORDER — CLOTRIMAZOLE-BETAMETHASONE 1-0.05 % EX CREA: 1.0000 "application " | TOPICAL_CREAM | Freq: Two times a day (BID) | CUTANEOUS | 0 refills | Status: DC

## 2018-08-08 NOTE — Patient Instructions (Addendum)
If you have lab work done today you will be contacted with your lab results within the next 2 weeks.  If you have not heard from [place] then please contact us. The fastest way to get your results is to register for My Chart.   IF you received an x-ray today, you will receive an invoice from [facility]. Please contact [facility] at [phone] with questions or concerns regarding your invoice.   IF you received labwork today, you will receive an invoice from [facility]. Please contact [facility] at [phone] with questions or concerns regarding your invoice.   Our billing staff will not be able to assist you with questions regarding bills from these companies.  You will be contacted with the lab results as soon as they are available. The fastest way to get your results is to activate your My Chart account. Instructions are located on the last page of this paperwork. If you have not heard from [place] regarding the results in 2 weeks, please contact this office.      Skin Yeast Infection Skin yeast infection is a condition in which there is an overgrowth of yeast (candida) that normally lives on the skin. This condition usually occurs in areas of the skin that are constantly warm and moist, such as the armpits or the groin. What are the causes? This condition is caused by a change in the normal balance of the yeast and bacteria that live on the skin. What increases the risk? This condition is more likely to develop in:  People who are obese.  Pregnant women.  Women who take birth control pills.  People who have diabetes.  People who take antibiotic medicines.  People who take steroid medicines.  People who are malnourished.  People who have a weak defense (immune) system.  People who are 65 years of age or older.  What are the signs or symptoms? Symptoms of this condition include:  A red, swollen area of the skin.  Bumps on the skin.  Itchiness.  How  is this diagnosed? This condition is diagnosed with a medical history and physical exam. Your health care provider may check for yeast by taking light scrapings of the skin to be viewed under a microscope. How is this treated? This condition is treated with medicine. Medicines may be prescribed or be available over-the-counter. The medicines may be:  Taken by mouth (orally).  Applied as a cream.  Follow these instructions at home:  Take or apply over-the-counter and prescription medicines only as told by your health care provider.  Eat more yogurt. This may help to keep your yeast infection from returning.  Maintain a healthy weight. If you need help losing weight, talk with your health care provider.  Keep your skin clean and dry.  If you have diabetes, keep your blood sugar under control. Contact a health care provider if:  Your symptoms go away and then return.  Your symptoms do not get better with treatment.  Your symptoms get worse.  Your rash spreads.  You have a fever or chills.  You have new symptoms.  You have new warmth or redness of your skin. This information is not intended to replace advice given to you by your health care provider. Make sure you discuss any questions you have with your health care provider. Document Released: 07/11/2011 Document Revised: 06/18/2016 Document Reviewed: 04/26/2015 Elsevier Interactive Patient Education  2018 [employer]. Insomnia Insomnia is a sleep disorder that makes it difficult  to fall asleep or to stay asleep. Insomnia can cause tiredness (fatigue), low energy, difficulty concentrating, mood swings, and poor performance at work or school. There are three different ways to classify insomnia:  Difficulty falling asleep.  Difficulty staying asleep.  Waking up too early in the morning.  Any type of insomnia can be long-term (chronic) or short-term (acute). Both are common. Short-term insomnia usually lasts for three  months or less. Chronic insomnia occurs at least three times a week for longer than three months. What are the causes? Insomnia may be caused by another condition, situation, or substance, such as:  Anxiety.  Certain medicines.  Gastroesophageal reflux disease (GERD) or other gastrointestinal conditions.  Asthma or other breathing conditions.  Restless legs syndrome, sleep apnea, or other sleep disorders.  Chronic pain.  Menopause. This may include hot flashes.  Stroke.  Abuse of alcohol, tobacco, or illegal drugs.  Depression.  Caffeine.  Neurological disorders, such as Alzheimer disease.  An overactive thyroid (hyperthyroidism).  The cause of insomnia may not be known. What increases the risk? Risk factors for insomnia include:  Gender. Women are more commonly affected than men.  Age. Insomnia is more common as you get older.  Stress. This may involve your professional or personal life.  Income. Insomnia is more common in people with lower income.  Lack of exercise.  Irregular work schedule or night shifts.  Traveling between different time zones.  What are the signs or symptoms? If you have insomnia, trouble falling asleep or trouble staying asleep is the main symptom. This may lead to other symptoms, such as:  Feeling fatigued.  Feeling nervous about going to sleep.  Not feeling rested in the morning.  Having trouble concentrating.  Feeling irritable, anxious, or depressed.  How is this treated? Treatment for insomnia depends on the cause. If your insomnia is caused by an underlying condition, treatment will focus on addressing the condition. Treatment may also include:  Medicines to help you sleep.  Counseling or therapy.  Lifestyle adjustments.  Follow these instructions at home:  Take medicines only as directed by your health care provider.  Keep regular sleeping and waking hours. Avoid naps.  Keep a sleep diary to help you and your  health care provider figure out what could be causing your insomnia. Include: ? When you sleep. ? When you wake up during the night. ? How well you sleep. ? How rested you feel the next day. ? Any side effects of medicines you are taking. ? What you eat and drink.  Make your bedroom a comfortable place where it is easy to fall asleep: ? Put up shades or special blackout curtains to block light from outside. ? Use a white noise machine to block noise. ? Keep the temperature cool.  Exercise regularly as directed by your health care provider. Avoid exercising right before bedtime.  Use relaxation techniques to manage stress. Ask your health care provider to suggest some techniques that may work well for you. These may include: ? Breathing exercises. ? Routines to release muscle tension. ? Visualizing peaceful scenes.  Cut back on alcohol, caffeinated beverages, and cigarettes, especially close to bedtime. These can disrupt your sleep.  Do not overeat or eat spicy foods right before bedtime. This can lead to digestive discomfort that can make it hard for you to sleep.  Limit screen use before bedtime. This includes: ? Watching TV. ? Using your smartphone, tablet, and computer.  Stick to a routine. This can help  you fall asleep faster. Try to do a quiet activity, brush your teeth, and go to bed at the same time each night.  Get out of bed if you are still awake after 15 minutes of trying to sleep. Keep the lights down, but try reading or doing a quiet activity. When you feel sleepy, go back to bed.  Make sure that you drive carefully. Avoid driving if you feel very sleepy.  Keep all follow-up appointments as directed by your health care provider. This is important. Contact a health care provider if:  You are tired throughout the day or have trouble in your daily routine due to sleepiness.  You continue to have sleep problems or your sleep problems get worse. Get help right away  if:  You have serious thoughts about hurting yourself or someone else. This information is not intended to replace advice given to you by your health care provider. Make sure you discuss any questions you have with your health care provider. Document Released: 10/20/2000 Document Revised: 03/24/2016 Document Reviewed: 07/24/2014 Elsevier Interactive Patient Education  [employer].

## 2018-08-08 NOTE — Progress Notes (Signed)
Brittany Gomez 67 y.o.   Chief Complaint  Patient presents with  . Rash    under her C-section scar; states it bothers her all the time  . Insomnia    having lots of family issues; and wants something for her nerves    HISTORY OF PRESENT ILLNESS: This is a 67 y.o. female complaining of 3 things: 1.  Rash to the pelvic area for the past couple weeks.  Tried Neosporin without relief. 2.  Bilateral leg cramps also for couple weeks. 3.  Lots of family problems creating a tremendous amount of stress leading to insomnia.  Unable to sleep and rest. PCP started patient on Lexapro several weeks ago.  Also started trazodone at bedtime but not working.  Lotrisone  HPI   Prior to Admission medications   Medication Sig Start Date End Date Taking? Authorizing Provider  escitalopram (LEXAPRO) 10 MG tablet TK 1 T PO ONCE A DAY FOR MOOD 07/14/18   [provider]  traZODone (DESYREL) 50 MG tablet TK 1 T PO QD HS PRN 07/14/18   [provider]    No Known Allergies  There are no active problems to display for this patient.   Past Medical History:  Diagnosis Date  . Allergy   . Anxiety   . Arthritis     Past Surgical History:  Procedure Laterality Date  . CESAREAN SECTION      Social History   Socioeconomic History  . Marital status: Single    Spouse name: Not on file  . Number of children: Not on file  . Years of education: Not on file  . Highest education level: Not on file  Occupational History  . Not on file  Social Needs  . Financial resource strain: Not on file  . Food insecurity:    Worry: Not on file    Inability: Not on file  . Transportation needs:    Medical: Not on file    Non-medical: Not on file  Tobacco Use  . Smoking status: Former Smoker    Types: Cigarettes  . Smokeless tobacco: Never Used  Substance and Sexual Activity  . Alcohol use: No  . Drug use: No  . Sexual activity: Not Currently  Lifestyle  . Physical activity:   Days per week: Not on file    Minutes per session: Not on file  . Stress: Not on file  Relationships  . Social connections:    Talks on phone: Not on file    Gets together: Not on file    Attends religious service: Not on file    Active member of club or organization: Not on file    Attends meetings of clubs or organizations: Not on file    Relationship status: Not on file  . Intimate partner violence:    Fear of current or ex partner: Not on file    Emotionally abused: Not on file    Physically abused: Not on file    Forced sexual activity: Not on file  Other Topics Concern  . Not on file  Social History Narrative  . Not on file    Family History  Problem Relation Age of Onset  . Diabetes Mother   . Stroke Father   . Cancer Sister   . Hypertension Sister   . Breast cancer Neg Hx      Review of Systems  Constitutional: Negative.  Negative for chills and fever.  HENT: Negative.  Negative for sore throat.  Eyes: Negative for blurred vision and double vision.  Respiratory: Negative.  Negative for cough and shortness of breath.   Cardiovascular: Negative.  Negative for chest pain and palpitations.  Gastrointestinal: Negative.  Negative for abdominal pain, diarrhea, nausea and vomiting.  Genitourinary: Negative.  Negative for dysuria and hematuria.  Musculoskeletal:       Leg cramps  Skin: Positive for rash.  Neurological: Negative.  Negative for dizziness and headaches.  Endo/Heme/Allergies: Negative.   Psychiatric/Behavioral: The patient is nervous/anxious.   All other systems reviewed and are negative.  Vitals:   08/08/18 0917  BP: 132/84  Pulse: 86  Resp: 16  Temp: 98.4 F (36.9 C)  SpO2: 93%     Physical Exam  Constitutional: She is oriented to person, place, and time. She appears well-developed and well-nourished.  HENT:  Head: Normocephalic and atraumatic.  Nose: Nose normal.  Mouth/Throat: Oropharynx is clear and moist.  Eyes: Pupils are equal,  round, and reactive to light. Conjunctivae and EOM are normal.  Neck: Normal range of motion. Neck supple. No JVD present.  Cardiovascular: Normal rate, regular rhythm and normal heart sounds.  Pulmonary/Chest: Effort normal and breath sounds normal.  Abdominal: Soft. There is no tenderness.  Musculoskeletal: Normal range of motion.  Lower extremities: Full range of motion.  Within normal limits.  Lymphadenopathy:    She has no cervical adenopathy.  Neurological: She is alert and oriented to person, place, and time. No sensory deficit. She exhibits normal muscle tone. Coordination normal.  Skin: Skin is warm and dry. Capillary refill takes less than 2 seconds. No rash noted.  Positive yeast rash on pelvic area around C-section scar  Psychiatric: She has a normal mood and affect. Her behavior is normal.  Vitals reviewed.  A total of 25 minutes was spent in the room with the patient, greater than 50% of which was in counseling/coordination of care regarding differential diagnosis, management, medications, and need for follow-up.   ASSESSMENT & PLAN: Brittany Gomez was seen today for rash and insomnia.  Diagnoses and all orders for this visit:  Yeast dermatitis -     clotrimazole-betamethasone (LOTRISONE) cream; Apply 1 application topically 2 (two) times daily.  Insomnia, unspecified type -     hydrOXYzine (VISTARIL) 50 MG capsule; Take 1 capsule (50 mg total) by mouth at bedtime as needed.  Situational anxiety  Bilateral leg cramps    Patient Instructions       If you have lab work done today you will be contacted with your lab results within the next 2 weeks.  If you have not heard from [place] then please contact us. The fastest way to get your results is to register for My Chart.   IF you received an x-ray today, you will receive an invoice from [facility]. Please contact [facility] at [phone] with questions or concerns regarding your invoice.   IF you  received labwork today, you will receive an invoice from [facility]. Please contact [facility] at [phone] with questions or concerns regarding your invoice.   Our billing staff will not be able to assist you with questions regarding bills from these companies.  You will be contacted with the lab results as soon as they are available. The fastest way to get your results is to activate your My Chart account. Instructions are located on the last page of this paperwork. If you have not heard from [place] regarding the results in 2 weeks, please contact this office.      Skin  Yeast Infection Skin yeast infection is a condition in which there is an overgrowth of yeast (candida) that normally lives on the skin. This condition usually occurs in areas of the skin that are constantly warm and moist, such as the armpits or the groin. What are the causes? This condition is caused by a change in the normal balance of the yeast and bacteria that live on the skin. What increases the risk? This condition is more likely to develop in:  People who are obese.  Pregnant women.  Women who take birth control pills.  People who have diabetes.  People who take antibiotic medicines.  People who take steroid medicines.  People who are malnourished.  People who have a weak defense (immune) system.  People who are 59 years of age or older.  What are the signs or symptoms? Symptoms of this condition include:  A red, swollen area of the skin.  Bumps on the skin.  Itchiness.  How is this diagnosed? This condition is diagnosed with a medical history and physical exam. Your health care provider may check for yeast by taking light scrapings of the skin to be viewed under a microscope. How is this treated? This condition is treated with medicine. Medicines may be prescribed or be available over-the-counter. The medicines may be:  Taken by mouth (orally).  Applied as a cream.  Follow these instructions  at home:  Take or apply over-the-counter and prescription medicines only as told by your health care provider.  Eat more yogurt. This may help to keep your yeast infection from returning.  Maintain a healthy weight. If you need help losing weight, talk with your health care provider.  Keep your skin clean and dry.  If you have diabetes, keep your blood sugar under control. Contact a health care provider if:  Your symptoms go away and then return.  Your symptoms do not get better with treatment.  Your symptoms get worse.  Your rash spreads.  You have a fever or chills.  You have new symptoms.  You have new warmth or redness of your skin. This information is not intended to replace advice given to you by your health care provider. Make sure you discuss any questions you have with your health care provider. Document Released: 07/11/2011 Document Revised: 06/18/2016 Document Reviewed: 04/26/2015 Elsevier Interactive Patient Education  2018 [employer]. Insomnia Insomnia is a sleep disorder that makes it difficult to fall asleep or to stay asleep. Insomnia can cause tiredness (fatigue), low energy, difficulty concentrating, mood swings, and poor performance at work or school. There are three different ways to classify insomnia:  Difficulty falling asleep.  Difficulty staying asleep.  Waking up too early in the morning.  Any type of insomnia can be long-term (chronic) or short-term (acute). Both are common. Short-term insomnia usually lasts for three months or less. Chronic insomnia occurs at least three times a week for longer than three months. What are the causes? Insomnia may be caused by another condition, situation, or substance, such as:  Anxiety.  Certain medicines.  Gastroesophageal reflux disease (GERD) or other gastrointestinal conditions.  Asthma or other breathing conditions.  Restless legs syndrome, sleep apnea, or other sleep disorders.  Chronic  pain.  Menopause. This may include hot flashes.  Stroke.  Abuse of alcohol, tobacco, or illegal drugs.  Depression.  Caffeine.  Neurological disorders, such as Alzheimer disease.  An overactive thyroid (hyperthyroidism).  The cause of insomnia may not be known. What increases the risk? Risk  factors for insomnia include:  Gender. Women are more commonly affected than men.  Age. Insomnia is more common as you get older.  Stress. This may involve your professional or personal life.  Income. Insomnia is more common in people with lower income.  Lack of exercise.  Irregular work schedule or night shifts.  Traveling between different time zones.  What are the signs or symptoms? If you have insomnia, trouble falling asleep or trouble staying asleep is the main symptom. This may lead to other symptoms, such as:  Feeling fatigued.  Feeling nervous about going to sleep.  Not feeling rested in the morning.  Having trouble concentrating.  Feeling irritable, anxious, or depressed.  How is this treated? Treatment for insomnia depends on the cause. If your insomnia is caused by an underlying condition, treatment will focus on addressing the condition. Treatment may also include:  Medicines to help you sleep.  Counseling or therapy.  Lifestyle adjustments.  Follow these instructions at home:  Take medicines only as directed by your health care provider.  Keep regular sleeping and waking hours. Avoid naps.  Keep a sleep diary to help you and your health care provider figure out what could be causing your insomnia. Include: ? When you sleep. ? When you wake up during the night. ? How well you sleep. ? How rested you feel the next day. ? Any side effects of medicines you are taking. ? What you eat and drink.  Make your bedroom a comfortable place where it is easy to fall asleep: ? Put up shades or special blackout curtains to block light from outside. ? Use a  white noise machine to block noise. ? Keep the temperature cool.  Exercise regularly as directed by your health care provider. Avoid exercising right before bedtime.  Use relaxation techniques to manage stress. Ask your health care provider to suggest some techniques that may work well for you. These may include: ? Breathing exercises. ? Routines to release muscle tension. ? Visualizing peaceful scenes.  Cut back on alcohol, caffeinated beverages, and cigarettes, especially close to bedtime. These can disrupt your sleep.  Do not overeat or eat spicy foods right before bedtime. This can lead to digestive discomfort that can make it hard for you to sleep.  Limit screen use before bedtime. This includes: ? Watching TV. ? Using your smartphone, tablet, and computer.  Stick to a routine. This can help you fall asleep faster. Try to do a quiet activity, brush your teeth, and go to bed at the same time each night.  Get out of bed if you are still awake after 15 minutes of trying to sleep. Keep the lights down, but try reading or doing a quiet activity. When you feel sleepy, go back to bed.  Make sure that you drive carefully. Avoid driving if you feel very sleepy.  Keep all follow-up appointments as directed by your health care provider. This is important. Contact a health care provider if:  You are tired throughout the day or have trouble in your daily routine due to sleepiness.  You continue to have sleep problems or your sleep problems get worse. Get help right away if:  You have serious thoughts about hurting yourself or someone else. This information is not intended to replace advice given to you by your health care provider. Make sure you discuss any questions you have with your health care provider. Document Released: 10/20/2000 Document Revised: 03/24/2016 Document Reviewed: 07/24/2014 Elsevier Interactive Patient Education  2018 [employer]      Edwina Barth,  MD [facility]

## 2018-08-29 ENCOUNTER — Encounter: Payer: Self-pay | Admitting: Internal Medicine

## 2018-08-29 DIAGNOSIS — M8588 Other specified disorders of bone density and structure, other site: Secondary | ICD-10-CM | POA: Diagnosis not present

## 2018-08-29 DIAGNOSIS — E2839 Other primary ovarian failure: Secondary | ICD-10-CM | POA: Diagnosis not present

## 2018-09-30 ENCOUNTER — Other Ambulatory Visit: Payer: Self-pay | Admitting: Emergency Medicine

## 2018-09-30 DIAGNOSIS — G4700 Insomnia, unspecified: Secondary | ICD-10-CM

## 2018-11-21 DIAGNOSIS — Z1239 Encounter for other screening for malignant neoplasm of breast: Secondary | ICD-10-CM | POA: Diagnosis not present

## 2018-11-21 DIAGNOSIS — Z23 Encounter for immunization: Secondary | ICD-10-CM | POA: Diagnosis not present

## 2018-11-21 DIAGNOSIS — Z1211 Encounter for screening for malignant neoplasm of colon: Secondary | ICD-10-CM | POA: Diagnosis not present

## 2018-11-21 DIAGNOSIS — F419 Anxiety disorder, unspecified: Secondary | ICD-10-CM | POA: Diagnosis not present

## 2018-11-21 DIAGNOSIS — F5101 Primary insomnia: Secondary | ICD-10-CM | POA: Diagnosis not present

## 2019-01-16 ENCOUNTER — Other Ambulatory Visit: Payer: Self-pay

## 2019-01-16 ENCOUNTER — Encounter: Payer: Self-pay | Admitting: Family Medicine

## 2019-01-16 ENCOUNTER — Ambulatory Visit (INDEPENDENT_AMBULATORY_CARE_PROVIDER_SITE_OTHER): Payer: Medicare HMO | Admitting: Family Medicine

## 2019-01-16 VITALS — BP 114/78 | HR 77 | Temp 98.1°F | Ht 69.0 in | Wt 159.4 lb

## 2019-01-16 DIAGNOSIS — F4323 Adjustment disorder with mixed anxiety and depressed mood: Secondary | ICD-10-CM | POA: Diagnosis not present

## 2019-01-16 DIAGNOSIS — G4700 Insomnia, unspecified: Secondary | ICD-10-CM

## 2019-01-16 MED ORDER — ESCITALOPRAM OXALATE 20 MG PO TABS: 20.0000 mg | ORAL_TABLET | Freq: Every day | ORAL | 3 refills | Status: DC

## 2019-01-16 MED ORDER — MIRTAZAPINE 7.5 MG PO TABS: 7.5000 mg | ORAL_TABLET | Freq: Every day | ORAL | 3 refills | Status: DC

## 2019-01-16 NOTE — Progress Notes (Signed)
3/12/20204:37 PM  Brittany Gomez [birth year], 68 y.o. female [id number]  Chief Complaint  Patient presents with   Anxiety    house situation    Insomnia    HPI:   Patient is a 68 y.o. female with past medical history significant for anxiety and insomnia who presents today for followup  Last OV Oct Dr Alvy Bimler Started vistaril at bedtime Having issues at home, husband Still on lexapro 10mg , helped some when started trazadone nor hydroxyzine did not help Has tried melatonin, did not help Waking up multiple times a night, denies waking up gasping for air, does not snore, does not wake up to urinate. Has problems also falling asleep Used to be on xanax in the past   Fall Risk  01/16/2019 07/04/2018 06/20/2018 04/02/2018  Falls in the past year? 0 No No No  Number falls in past yr: 0 - - -  Injury with Fall? 0 - - -    GAD 7 : Generalized Anxiety Score 01/16/2019  Nervous, Anxious, on Edge 3  Control/stop worrying 3  Worry too much - different things 3  Trouble relaxing 3  Restless 3  Easily annoyed or irritable 3  Afraid - awful might happen 3  Total GAD 7 Score 21  Anxiety Difficulty Very difficult    Depression screen [facility] 2/9 01/16/2019 08/08/2018 07/04/2018  Decreased Interest 2 0 0  Down, Depressed, Hopeless 2 0 0  PHQ - 2 Score 4 0 0  Altered sleeping 3 - -  Tired, decreased energy 3 - -  Change in appetite 2 - -  Feeling bad or failure about yourself  1 - -  Trouble concentrating 2 - -  Moving slowly or fidgety/restless 2 - -  Suicidal thoughts 0 - -  PHQ-9 Score 17 - -  Difficult doing work/chores Very difficult - -    No Known Allergies  Prior to Admission medications   Medication Sig Start Date End Date Taking? Authorizing Provider  clotrimazole-betamethasone (LOTRISONE) cream Apply 1 application topically 2 (two) times daily. 08/08/18  Yes Sagardia, Eilleen Kempf, MD  escitalopram (LEXAPRO) 10 MG tablet TK 1 T PO ONCE A DAY FOR MOOD 07/14/18  Yes  [provider]    Past Medical History:  Diagnosis Date   Allergy    Anxiety    Arthritis     Past Surgical History:  Procedure Laterality Date   CESAREAN SECTION      Social History   Tobacco Use   Smoking status: Former Smoker    Types: Cigarettes   Smokeless tobacco: Never Used  Substance Use Topics   Alcohol use: No    Family History  Problem Relation Age of Onset   Diabetes Mother    Stroke Father    Cancer Sister    Hypertension Sister    Breast cancer Neg Hx     ROS Per hpi  OBJECTIVE:  Blood pressure 114/78, pulse 77, temperature 98.1 F (36.7 C), temperature source Oral, height 5\' 9"  (1.753 m), weight 159 lb 6.4 oz (72.3 kg), SpO2 93 %. Body mass index is 23.54 kg/m.   Physical Exam Vitals signs and nursing note reviewed.  Constitutional:      Appearance: She is well-developed.  HENT:     Head: Normocephalic and atraumatic.  Eyes:     General: No scleral icterus.    Conjunctiva/sclera: Conjunctivae normal.     Pupils: Pupils are equal, round, and reactive to light.  Neck:  Musculoskeletal: Neck supple.  Pulmonary:     Effort: Pulmonary effort is normal.  Skin:    General: Skin is warm and dry.  Neurological:     Mental Status: She is alert and oriented to person, place, and time.      ASSESSMENT and PLAN  1. Adjustment disorder with mixed anxiety and depressed mood 2. Insomnia, unspecified type Uncontrolled. Increasing lexapro 20mg . Adding low dose remeron for sleep. Patient declines counseling at this time.   Other orders - escitalopram (LEXAPRO) 20 MG tablet; Take 1 tablet (20 mg total) by mouth daily. - mirtazapine (REMERON) 7.5 MG tablet; Take 1 tablet (7.5 mg total) by mouth at bedtime.  Return in about 3 months (around 04/18/2019).    Myles Lipps, MD Primary Care at [facility] [address] [place], [place] [address] Ph.  [phone] Fax [phone]

## 2019-01-16 NOTE — Patient Instructions (Signed)
° ° ° °  If you have lab work done today you will be contacted with your lab results within the next 2 weeks.  If you have not heard from us then please contact us. The fastest way to get your results is to register for My Chart. ° ° °IF you received an x-ray today, you will receive an invoice from [facility]. Please contact [facility] at [phone] with questions or concerns regarding your invoice.  ° °IF you received labwork today, you will receive an invoice from [facility]. Please contact [facility] at [phone] with questions or concerns regarding your invoice.  ° °Our billing staff will not be able to assist you with questions regarding bills from these companies. ° °You will be contacted with the lab results as soon as they are available. The fastest way to get your results is to activate your My Chart account. Instructions are located on the last page of this paperwork. If you have not heard from us regarding the results in 2 weeks, please contact this office. °  ° ° ° °

## 2019-03-06 ENCOUNTER — Telehealth: Payer: Self-pay | Admitting: Family Medicine

## 2019-03-06 NOTE — Telephone Encounter (Signed)
Copied from CRM [id number]. Topic: General - Other >> Mar 06, 2019  1:56 PM Percival Spanish wrote:  Pt call to say the following medicine is not working for her  mirtazapine (REMERON) 7.5 MG tablet  and is asking for something else. She said she also has a swollen jaw from a tooth ache would like a call back

## 2019-03-06 NOTE — Telephone Encounter (Signed)
Please see note below and change rx is appropriate and scheduling team please get pt tele med apt for swollen jaw and tooth ache.

## 2019-03-07 MED ORDER — MIRTAZAPINE 15 MG PO TABS: 15.0000 mg | ORAL_TABLET | Freq: Every day | ORAL | 2 refills | Status: DC

## 2019-03-07 NOTE — Telephone Encounter (Signed)
Please let patient know that I increased her mirtazapine (remeron) dose. New rx sent. thanks

## 2019-03-13 NOTE — Telephone Encounter (Signed)
I have spoken to pec and they will relay the message.   Thanks,

## 2019-03-13 NOTE — Telephone Encounter (Signed)
LVM to call back.

## 2019-04-18 ENCOUNTER — Ambulatory Visit: Payer: Medicare HMO | Admitting: Family Medicine

## 2019-05-02 ENCOUNTER — Ambulatory Visit (INDEPENDENT_AMBULATORY_CARE_PROVIDER_SITE_OTHER): Payer: Medicare HMO | Admitting: Family Medicine

## 2019-05-02 ENCOUNTER — Encounter: Payer: Self-pay | Admitting: Family Medicine

## 2019-05-02 ENCOUNTER — Other Ambulatory Visit: Payer: Self-pay

## 2019-05-02 VITALS — BP 131/82 | HR 91 | Temp 98.7°F | Ht 65.0 in | Wt 167.4 lb

## 2019-05-02 DIAGNOSIS — W57XXXA Bitten or stung by nonvenomous insect and other nonvenomous arthropods, initial encounter: Secondary | ICD-10-CM

## 2019-05-02 DIAGNOSIS — F4323 Adjustment disorder with mixed anxiety and depressed mood: Secondary | ICD-10-CM

## 2019-05-02 DIAGNOSIS — G4700 Insomnia, unspecified: Secondary | ICD-10-CM | POA: Diagnosis not present

## 2019-05-02 DIAGNOSIS — S30860A Insect bite (nonvenomous) of lower back and pelvis, initial encounter: Secondary | ICD-10-CM

## 2019-05-02 MED ORDER — ESCITALOPRAM OXALATE 20 MG PO TABS: 20.0000 mg | ORAL_TABLET | Freq: Every day | ORAL | 1 refills | Status: DC

## 2019-05-02 MED ORDER — MIRTAZAPINE 15 MG PO TABS: 15.0000 mg | ORAL_TABLET | Freq: Every day | ORAL | 1 refills | Status: DC

## 2019-05-02 MED ORDER — DOXYCYCLINE HYCLATE 100 MG PO TABS: 100.0000 mg | ORAL_TABLET | Freq: Two times a day (BID) | ORAL | 0 refills | Status: DC

## 2019-05-02 NOTE — Patient Instructions (Signed)
° ° ° °  If you have lab work done today you will be contacted with your lab results within the next 2 weeks.  If you have not heard from us then please contact us. The fastest way to get your results is to register for My Chart. ° ° °IF you received an x-ray today, you will receive an invoice from [facility]. Please contact [facility] at [phone] with questions or concerns regarding your invoice.  ° °IF you received labwork today, you will receive an invoice from [facility]. Please contact [facility] at [phone] with questions or concerns regarding your invoice.  ° °Our billing staff will not be able to assist you with questions regarding bills from these companies. ° °You will be contacted with the lab results as soon as they are available. The fastest way to get your results is to activate your My Chart account. Instructions are located on the last page of this paperwork. If you have not heard from us regarding the results in 2 weeks, please contact this office. °  ° ° ° °

## 2019-05-02 NOTE — Progress Notes (Signed)
6/26/20205:24 PM  Brittany Gomez September 10, 1951, 68 y.o., female [id number]  Chief Complaint  Patient presents with  . Medication Management    sleep medication   . bit by tick    2 tick bites back 1 week from eachother last tick bite was last week.    HPI:   Patient is a 68 y.o. female with past medical history significant for anxiety and insomnia who presents today for followup  Last OV March 2020 Increased lexapro, started remeron Anxiety and depression doing much better Sleeping better  Current house is being placed on the market, trying to buy it Has had some tick bites, not sure of when, one at least a week ago, the other might be more recent Not having rashes, malaise, fever or chills Her 2 children have both had RMSF  GAD 7 : Generalized Anxiety Score 05/02/2019 01/16/2019 01/16/2019  Nervous, Anxious, on Edge 2 3 3   Control/stop worrying 2 3 3   Worry too much - different things 2 3 3   Trouble relaxing 2 3 3   Restless 1 3 3   Easily annoyed or irritable 1 3 3   Afraid - awful might happen 1 2 3   Total GAD 7 Score 11 20 21   Anxiety Difficulty Somewhat difficult Very difficult Very difficult     Depression screen [facility] 2/9 05/02/2019 01/16/2019 01/16/2019  Decreased Interest 1 2 2   Down, Depressed, Hopeless 2 2 2   PHQ - 2 Score 3 4 4   Altered sleeping 0 3 3  Tired, decreased energy 2 3 3   Change in appetite 1 2 2   Feeling bad or failure about yourself  1 1 1   Trouble concentrating 1 2 2   Moving slowly or fidgety/restless 0 2 2  Suicidal thoughts 0 0 0  PHQ-9 Score 8 17 17   Difficult doing work/chores Somewhat difficult Very difficult Very difficult    Fall Risk  05/02/2019 01/16/2019 07/04/2018 06/20/2018 04/02/2018  Falls in the past year? 0 0 No No No  Number falls in past yr: 0 0 - - -  Injury with Fall? 0 0 - - -  Follow up Falls evaluation completed - - - -     No Known Allergies  Prior to Admission medications   Medication Sig Start Date End Date  Taking? Authorizing Provider  escitalopram (LEXAPRO) 20 MG tablet Take 1 tablet (20 mg total) by mouth daily. 01/16/19  Yes Brittany Guys, MD  mirtazapine (REMERON) 15 MG tablet Take 1 tablet (15 mg total) by mouth at bedtime. 03/07/19  Yes Brittany Guys, MD  clotrimazole-betamethasone (LOTRISONE) cream Apply 1 application topically 2 (two) times daily. Patient not taking: Reported on 05/02/2019 08/08/18   Brittany Pollen, MD    Past Medical History:  Diagnosis Date  . Allergy   . Anxiety   . Arthritis     Past Surgical History:  Procedure Laterality Date  . CESAREAN SECTION      Social History   Tobacco Use  . Smoking status: Former Smoker    Types: Cigarettes  . Smokeless tobacco: Never Used  Substance Use Topics  . Alcohol use: No    Family History  Problem Relation Age of Onset  . Diabetes Mother   . Stroke Father   . Cancer Sister   . Hypertension Sister   . Breast cancer Neg Hx     ROS Per hpi  OBJECTIVE:  Today's Vitals   05/02/19 1657  BP: 131/82  Pulse: 91  Temp: 98.7  F (37.1 C)  TempSrc: Oral  SpO2: 95%  Weight: 167 lb 6.4 oz (75.9 kg)  Height: 5\' 5"  (1.651 m)   Body mass index is 27.86 kg/m.   Physical Exam Vitals signs and nursing note reviewed.  Constitutional:      Appearance: She is well-developed.  HENT:     Head: Normocephalic and atraumatic.  Eyes:     General: No scleral icterus.    Conjunctiva/sclera: Conjunctivae normal.     Pupils: Pupils are equal, round, and reactive to light.  Neck:     Musculoskeletal: Neck supple.  Pulmonary:     Effort: Pulmonary effort is normal.  Skin:    General: Skin is warm and dry.     Findings: No rash.  Neurological:     Mental Status: She is alert and oriented to person, place, and time.    ASSESSMENT and PLAN  1. Adjustment disorder with mixed anxiety and depressed mood 2. Insomnia, unspecified type Controlled. Continue current regime.   3. Tick bite of back, initial  encounter rx for doxycycline provided.   Other orders - doxycycline (VIBRA-TABS) 100 MG tablet; Take 1 tablet (100 mg total) by mouth 2 (two) times daily. - mirtazapine (REMERON) 15 MG tablet; Take 1 tablet (15 mg total) by mouth at bedtime. - escitalopram (LEXAPRO) 20 MG tablet; Take 1 tablet (20 mg total) by mouth daily.  Return in about 6 months (around 11/01/2019).    Brittany LippsIrma M Santiago, MD Primary Care at [facility] [address] [place], [place] [address] Ph.  [phone] Fax [phone]

## 2019-05-21 ENCOUNTER — Other Ambulatory Visit: Payer: Self-pay | Admitting: Family Medicine

## 2019-05-22 DIAGNOSIS — J301 Allergic rhinitis due to pollen: Secondary | ICD-10-CM | POA: Diagnosis not present

## 2019-05-22 DIAGNOSIS — Z1211 Encounter for screening for malignant neoplasm of colon: Secondary | ICD-10-CM | POA: Diagnosis not present

## 2019-05-22 DIAGNOSIS — F419 Anxiety disorder, unspecified: Secondary | ICD-10-CM | POA: Diagnosis not present

## 2019-05-22 DIAGNOSIS — F5101 Primary insomnia: Secondary | ICD-10-CM | POA: Diagnosis not present

## 2019-07-21 ENCOUNTER — Ambulatory Visit (INDEPENDENT_AMBULATORY_CARE_PROVIDER_SITE_OTHER): Payer: Medicare HMO | Admitting: Family Medicine

## 2019-07-21 ENCOUNTER — Other Ambulatory Visit: Payer: Self-pay

## 2019-07-21 ENCOUNTER — Encounter: Payer: Self-pay | Admitting: Family Medicine

## 2019-07-21 ENCOUNTER — Ambulatory Visit (INDEPENDENT_AMBULATORY_CARE_PROVIDER_SITE_OTHER): Payer: Medicare HMO

## 2019-07-21 VITALS — BP 120/78 | HR 100 | Temp 98.9°F | Ht 65.0 in | Wt 172.0 lb

## 2019-07-21 DIAGNOSIS — R059 Cough, unspecified: Secondary | ICD-10-CM

## 2019-07-21 DIAGNOSIS — R05 Cough: Secondary | ICD-10-CM

## 2019-07-21 DIAGNOSIS — J441 Chronic obstructive pulmonary disease with (acute) exacerbation: Secondary | ICD-10-CM | POA: Diagnosis not present

## 2019-07-21 DIAGNOSIS — F17200 Nicotine dependence, unspecified, uncomplicated: Secondary | ICD-10-CM | POA: Diagnosis not present

## 2019-07-21 DIAGNOSIS — Z780 Asymptomatic menopausal state: Secondary | ICD-10-CM

## 2019-07-21 LAB — POCT CBC
Granulocyte percent: 61.1 %G (ref 37–80)
HCT, POC: 44 % — AB (ref 29–41)
Hemoglobin: 13.9 g/dL (ref 11–14.6)
Lymph, poc: 4.2 — AB (ref 0.6–3.4)
MCH, POC: 29.1 pg (ref 27–31.2)
MCHC: 31.6 g/dL — AB (ref 31.8–35.4)
MCV: 91.8 fL (ref 76–111)
MID (cbc): 0.5 (ref 0–0.9)
MPV: 7.4 fL (ref 0–99.8)
POC Granulocyte: 7.4 — AB (ref 2–6.9)
POC LYMPH PERCENT: 34.8 %L (ref 10–50)
POC MID %: 4.1 %M (ref 0–12)
Platelet Count, POC: 291 10*3/uL (ref 142–424)
RBC: 4.79 M/uL (ref 4.04–5.48)
RDW, POC: 14.5 %
WBC: 12.1 10*3/uL — AB (ref 4.6–10.2)

## 2019-07-21 MED ORDER — ALBUTEROL SULFATE HFA 108 (90 BASE) MCG/ACT IN AERS: 2.0000 | INHALATION_SPRAY | Freq: Four times a day (QID) | RESPIRATORY_TRACT | 2 refills | Status: DC | PRN

## 2019-07-21 MED ORDER — CHANTIX STARTING MONTH PAK 0.5 MG X 11 & 1 MG X 42 PO TABS: ORAL_TABLET | ORAL | 0 refills | Status: DC

## 2019-07-21 MED ORDER — DOXYCYCLINE HYCLATE 100 MG PO TABS: 100.0000 mg | ORAL_TABLET | Freq: Two times a day (BID) | ORAL | 0 refills | Status: DC

## 2019-07-21 MED ORDER — VARENICLINE TARTRATE 1 MG PO TABS: 1.0000 mg | ORAL_TABLET | Freq: Two times a day (BID) | ORAL | 3 refills | Status: DC

## 2019-07-21 NOTE — Patient Instructions (Addendum)
If you have lab work done today you will be contacted with your lab results within the next 2 weeks.  If you have not heard from [place] then please contact us. The fastest way to get your results is to register for My Chart.   IF you received an x-ray today, you will receive an invoice from [facility]. Please contact [facility] at [phone] with questions or concerns regarding your invoice.   IF you received labwork today, you will receive an invoice from [facility]. Please contact [facility] at [phone] with questions or concerns regarding your invoice.   Our billing staff will not be able to assist you with questions regarding bills from these companies.  You will be contacted with the lab results as soon as they are available. The fastest way to get your results is to activate your My Chart account. Instructions are located on the last page of this paperwork. If you have not heard from [place] regarding the results in 2 weeks, please contact this office.     Chronic Obstructive Pulmonary Disease Exacerbation Chronic obstructive pulmonary disease (COPD) is a long-term (chronic) lung problem. In COPD, the flow of air from the lungs is limited. COPD exacerbations are times that breathing gets worse and you need more than your normal treatment. Without treatment, they can be life threatening. If they happen often, your lungs can become more damaged. If your COPD gets worse, your doctor may treat you with:  Medicines.  Oxygen.  Different ways to clear your airway, such as using a mask. Follow these instructions at home: Medicines  Take over-the-counter and prescription medicines only as told by your doctor.  If you take an antibiotic or steroid medicine, do not stop taking the medicine even if you start to feel better.  Keep up with shots (vaccinations) as told by your doctor. Be sure to get a yearly (annual) flu shot. Lifestyle  Do not smoke. If you need help  quitting, ask your doctor.  Eat healthy foods.  Exercise regularly.  Get plenty of sleep.  Avoid tobacco smoke and other things that can bother your lungs.  Wash your hands often with soap and water. This will help keep you from getting an infection. If you cannot use soap and water, use hand sanitizer.  During flu season, avoid areas that are crowded with people. General instructions  Drink enough fluid to keep your pee (urine) clear or pale yellow. Do not do this if your doctor has told you not to.  Use a cool mist machine (vaporizer).  If you use oxygen or a machine that turns medicine into a mist (nebulizer), continue to use it as told.  Follow all instructions for rehabilitation. These are steps you can take to make your body work better.  Keep all follow-up visits as told by your doctor. This is important. Contact a doctor if:  Your COPD symptoms get worse than normal. Get help right away if:  You are short of breath and it gets worse.  You have trouble talking.  You have chest pain.  You cough up blood.  You have a fever.  You keep throwing up (vomiting).  You feel weak or you pass out (faint).  You feel confused.  You are not able to sleep because of your symptoms.  You are not able to do daily activities. Summary  COPD exacerbations are times that breathing gets worse and you need more treatment than normal.  COPD exacerbations can be  very serious and may cause your lungs to become more damaged.  Do not smoke. If you need help quitting, ask your doctor.  Stay up-to-date on your shots. Get a flu shot every year. This information is not intended to replace advice given to you by your health care provider. Make sure you discuss any questions you have with your health care provider. Document Released: 10/12/2011 Document Revised: 10/05/2017 Document Reviewed: 11/27/2016 Elsevier Patient Education  2020 [employer].

## 2019-07-21 NOTE — Progress Notes (Signed)
9/14/20204:07 PM  [place] 02-08-1951, 68 y.o., female [id number]  Chief Complaint  Patient presents with  . Wheezing    pt says she has been having px with bronchitis. She has been wheezing and coughing with phlegm. She is taking robutussin and other otc's to help with the sx  . Nicotine Dependence    wants to talk about getting started on chantix, she know her ins does not pay for this rx    HPI:   Patient is a 68 y.o. female with past medical history significant for anxiety who presents today for wheezing  Couple weeks ago started having mostly dry cough and wheezing, recently a bit more productive, yellow  Mild SOB with exertion Denies any fever, no chills, sore throat, loss of taste or smell, nausea, vomiting, diarrhea Reports mild sinus with left ear ache Reminds her of past times when she has had bronchitis Smokes 1/2 ppd, interested in quitting, tried chantix in the past and it worked for her, would like to try again Has never been diagnosed with COPD Denies any known covid exposure  Depression screen [facility] 2/9 05/02/2019 01/16/2019 01/16/2019  Decreased Interest 1 2 2   Down, Depressed, Hopeless 2 2 2   PHQ - 2 Score 3 4 4   Altered sleeping 0 3 3  Tired, decreased energy 2 3 3   Change in appetite 1 2 2   Feeling bad or failure about yourself  1 1 1   Trouble concentrating 1 2 2   Moving slowly or fidgety/restless 0 2 2  Suicidal thoughts 0 0 0  PHQ-9 Score 8 17 17   Difficult doing work/chores Somewhat difficult Very difficult Very difficult    Fall Risk  07/21/2019 05/02/2019 01/16/2019 07/04/2018 06/20/2018  Falls in the past year? 0 0 0 No No  Number falls in past yr: 0 0 0 - -  Injury with Fall? 0 0 0 - -  Follow up - Falls evaluation completed - - -     No Known Allergies  Prior to Admission medications   Medication Sig Start Date End Date Taking? Authorizing Provider  escitalopram (LEXAPRO) 20 MG tablet Take 1 tablet (20 mg total) by mouth daily.  05/02/19  Yes Rutherford Guys, MD  mirtazapine (REMERON) 7.5 MG tablet TAKE 1 TABLET BY MOUTH AT BEDTIME 05/21/19  Yes Rutherford Guys, MD    Past Medical History:  Diagnosis Date  . Allergy   . Anxiety   . Arthritis     Past Surgical History:  Procedure Laterality Date  . CESAREAN SECTION      Social History   Tobacco Use  . Smoking status: Former Smoker    Types: Cigarettes  . Smokeless tobacco: Never Used  Substance Use Topics  . Alcohol use: No    Family History  Problem Relation Age of Onset  . Diabetes Mother   . Stroke Father   . Cancer Sister   . Hypertension Sister   . Breast cancer Neg Hx     ROS PER HPI  OBJECTIVE:  Today's Vitals   07/21/19 1554  BP: 120/78  Pulse: 100  Temp: 98.9 F (37.2 C)  SpO2: 90%  Weight: 172 lb (78 kg)  Height: 5\' 5"  (1.651 m)   Body mass index is 28.62 kg/m.   Physical Exam Vitals signs and nursing note reviewed.  Constitutional:      Appearance: She is well-developed.  HENT:     Head: Normocephalic and atraumatic.     Right Ear:  Hearing, tympanic membrane, ear canal and external ear normal.     Left Ear: Hearing, tympanic membrane, ear canal and external ear normal.     Nose: Rhinorrhea present.     Right Turbinates: Swollen.     Left Turbinates: Swollen.     Right Sinus: No maxillary sinus tenderness or frontal sinus tenderness.     Left Sinus: Maxillary sinus tenderness and frontal sinus tenderness present.  Eyes:     Conjunctiva/sclera: Conjunctivae normal.     Pupils: Pupils are equal, round, and reactive to light.  Neck:     Musculoskeletal: Neck supple.  Cardiovascular:     Rate and Rhythm: Normal rate and regular rhythm.     Heart sounds: Normal heart sounds. No murmur. No friction rub. No gallop.   Pulmonary:     Effort: Pulmonary effort is normal.     Breath sounds: Normal breath sounds. No wheezing, rhonchi or rales.  Lymphadenopathy:     Cervical: No cervical adenopathy.  Skin:     General: Skin is warm and dry.  Neurological:     Mental Status: She is alert and oriented to person, place, and time.     Results for orders placed or performed in visit on 07/21/19 (from the past 24 hour(s))  POCT CBC     Status: Abnormal   Collection Time: 07/21/19  4:40 PM  Result Value Ref Range   WBC 12.1 (A) 4.6 - 10.2 K/uL   Lymph, poc 4.2 (A) 0.6 - 3.4   POC LYMPH PERCENT 34.8 10 - 50 %L   MID (cbc) 0.5 0 - 0.9   POC MID % 4.1 0 - 12 %M   POC Granulocyte 7.4 (A) 2 - 6.9   Granulocyte percent 61.1 37 - 80 %G   RBC 4.79 4.04 - 5.48 M/uL   Hemoglobin 13.9 11 - 14.6 g/dL   HCT, POC 04.5 (A) 29 - 41 %   MCV 91.8 76 - 111 fL   MCH, POC 29.1 27 - 31.2 pg   MCHC 31.6 (A) 31.8 - 35.4 g/dL   RDW, POC 99.7 %   Platelet Count, POC 291 142 - 424 K/uL   MPV 7.4 0 - 99.8 fL    Dg Chest 2 View  Result Date: 07/21/2019 CLINICAL DATA:  Productive cough, wheezing, smoker EXAM: CHEST - 2 VIEW COMPARISON:  05/23/2018 FINDINGS: There is hyperinflation of the lungs compatible with COPD. Heart and mediastinal contours are within normal limits. No focal opacities or effusions. No acute bony abnormality. IMPRESSION: COPD.  No active disease. Electronically Signed   By: Charlett Nose M.D.   On: 07/21/2019 16:38     ASSESSMENT and PLAN  1. COPD exacerbation (HCC) Discussed supportive measures, new meds r/se/b and RTC precautions. Patient educational handout given. Flu vaccine at next visit.   2. Cough - Novel Coronavirus, NAA ([facility]) - DG Chest 2 View; Future - POCT CBC  3. Tobacco dependence Smoking cessation instruction/counseling given for 3-10 min:  counseled patient on the dangers of tobacco use, advised patient to stop smoking, and reviewed strategies to maximize success   4. Postmenopausal estrogen deficiency - DG Bone Density; Future  Other orders - varenicline (CHANTIX STARTING MONTH PAK) 0.5 MG X 11 & 1 MG X 42 tablet; Take one 0.5 mg tablet by mouth once daily for 3 days,  then increase to one 0.5 mg tablet twice daily for 4 days, then increase to one 1 mg tablet twice daily. - varenicline (CHANTIX  CONTINUING MONTH PAK) 1 MG tablet; Take 1 tablet (1 mg total) by mouth 2 (two) times daily. - doxycycline (VIBRA-TABS) 100 MG tablet; Take 1 tablet (100 mg total) by mouth 2 (two) times daily. - albuterol (VENTOLIN HFA) 108 (90 Base) MCG/ACT inhaler; Inhale 2 puffs into the lungs every 6 (six) hours as needed for wheezing or shortness of breath.  Return in about 2 years (around 07/20/2021).    [name], MD Primary Care at [facility] [address] [place], [place] [address] Ph.  [phone] Fax [phone]

## 2019-07-22 ENCOUNTER — Telehealth: Payer: Self-pay | Admitting: Family Medicine

## 2019-07-22 LAB — NOVEL CORONAVIRUS, NAA: SARS-CoV-2, NAA: NOT DETECTED

## 2019-07-22 NOTE — Telephone Encounter (Signed)
Patient was seen yesterday and forgot to ask if an RX for cough syrup could be called into pharmacy. Please advise.    Pharmacy:  [facility] [place], [place] AT [facility] [place] [phone] (Phone) [phone] (Fax)

## 2019-07-25 NOTE — Telephone Encounter (Signed)
Please advise 

## 2019-07-28 ENCOUNTER — Ambulatory Visit (INDEPENDENT_AMBULATORY_CARE_PROVIDER_SITE_OTHER): Payer: Medicare HMO | Admitting: Family Medicine

## 2019-07-28 VITALS — BP 120/78 | Ht 70.0 in | Wt 172.0 lb

## 2019-07-28 DIAGNOSIS — Z0000 Encounter for general adult medical examination without abnormal findings: Secondary | ICD-10-CM

## 2019-07-28 NOTE — Telephone Encounter (Signed)
No cough syrup prescribed. If she still needs something, you can send in a rx for tessalon pearls, 100-200mg  TID as needed. Qty 20. thanks

## 2019-07-28 NOTE — Patient Instructions (Signed)
Thank you for taking time to come for your Medicare Wellness Visit. I appreciate your ongoing commitment to your health goals. Please review the following plan we discussed and let me know if I can assist you in the future.  Brittany Portell LPN  Preventive Care 65 Years and Older, Female Preventive care refers to lifestyle choices and visits with your health care provider that can promote health and wellness. This includes:  A yearly physical exam. This is also called an annual well check.  Regular dental and eye exams.  Immunizations.  Screening for certain conditions.  Healthy lifestyle choices, such as diet and exercise. What can I expect for my preventive care visit? Physical exam Your health care provider will check:  Height and weight. These may be used to calculate body mass index (BMI), which is a measurement that tells if you are at a healthy weight.  Heart rate and blood pressure.  Your skin for abnormal spots. Counseling Your health care provider may ask you questions about:  Alcohol, tobacco, and drug use.  Emotional well-being.  Home and relationship well-being.  Sexual activity.  Eating habits.  History of falls.  Memory and ability to understand (cognition).  Work and work environment.  Pregnancy and menstrual history. What immunizations do I need?  Influenza (flu) vaccine  This is recommended every year. Tetanus, diphtheria, and pertussis (Tdap) vaccine  You may need a Td booster every 10 years. Varicella (chickenpox) vaccine  You may need this vaccine if you have not already been vaccinated. Zoster (shingles) vaccine  You may need this after age 60. Pneumococcal conjugate (PCV13) vaccine  One dose is recommended after age 65. Pneumococcal polysaccharide (PPSV23) vaccine  One dose is recommended after age 65. Measles, mumps, and rubella (MMR) vaccine  You may need at least one dose of MMR if you were born in 1957 or later. You may also  need a second dose. Meningococcal conjugate (MenACWY) vaccine  You may need this if you have certain conditions. Hepatitis A vaccine  You may need this if you have certain conditions or if you travel or work in places where you may be exposed to hepatitis A. Hepatitis B vaccine  You may need this if you have certain conditions or if you travel or work in places where you may be exposed to hepatitis B. Haemophilus influenzae type b (Hib) vaccine  You may need this if you have certain conditions. You may receive vaccines as individual doses or as more than one vaccine together in one shot (combination vaccines). Talk with your health care provider about the risks and benefits of combination vaccines. What tests do I need? Blood tests  Lipid and cholesterol levels. These may be checked every 5 years, or more frequently depending on your overall health.  Hepatitis C test.  Hepatitis B test. Screening  Lung cancer screening. You may have this screening every year starting at age 55 if you have a 30-pack-year history of smoking and currently smoke or have quit within the past 15 years.  Colorectal cancer screening. All adults should have this screening starting at age 50 and continuing until age 75. Your health care provider may recommend screening at age 45 if you are at increased risk. You will have tests every 1-10 years, depending on your results and the type of screening test.  Diabetes screening. This is done by checking your blood sugar (glucose) after you have not eaten for a while (fasting). You may have this done every 1-3   years.  Mammogram. This may be done every 1-2 years. Talk with your health care provider about how often you should have regular mammograms.  BRCA-related cancer screening. This may be done if you have a family history of breast, ovarian, tubal, or peritoneal cancers. Other tests  Sexually transmitted disease (STD) testing.  Bone density scan. This is done  to screen for osteoporosis. You may have this done starting at age 65. Follow these instructions at home: Eating and drinking  Eat a diet that includes fresh fruits and vegetables, whole grains, lean protein, and low-fat dairy products. Limit your intake of foods with high amounts of sugar, saturated fats, and salt.  Take vitamin and mineral supplements as recommended by your health care provider.  Do not drink alcohol if your health care provider tells you not to drink.  If you drink alcohol: ? Limit how much you have to 0-1 drink a day. ? Be aware of how much alcohol is in your drink. In the [place] one drink equals one 12 oz bottle of beer (355 mL), one 5 oz glass of wine (148 mL), or one 1 oz glass of hard liquor (44 mL). Lifestyle  Take daily care of your teeth and gums.  Stay active. Exercise for at least 30 minutes on 5 or more days each week.  Do not use any products that contain nicotine or tobacco, such as cigarettes, e-cigarettes, and chewing tobacco. If you need help quitting, ask your health care provider.  If you are sexually active, practice safe sex. Use a condom or other form of protection in order to prevent STIs (sexually transmitted infections).  Talk with your health care provider about taking a low-dose aspirin or statin. What's next?  Go to your health care provider once a year for a well check visit.  Ask your health care provider how often you should have your eyes and teeth checked.  Stay up to date on all vaccines. This information is not intended to replace advice given to you by your health care provider. Make sure you discuss any questions you have with your health care provider. Document Released: 11/19/2015 Document Revised: 10/17/2018 Document Reviewed: 10/17/2018 Elsevier Patient Education  2020 Elsevier Inc.  

## 2019-07-28 NOTE — Progress Notes (Signed)
Presents today for [employer] Visit   Date of last exam:   Interpreter used for this visit? No  I connected with  Brittany Gomez on 07/28/19 by a telephone application and verified that I am speaking with the correct person using two identifiers.   I discussed the limitations of evaluation and management by telemedicine. The patient expressed understanding and agreed to proceed.   Patient Care Team: Myles Lipps, MD as PCP - General (Family Medicine)   Other items to address today:   Discussed immunizations Discussed Eye/Dental Mailed a savings card for patient for Chantix  Follow up appointment 08-04-2019 Dr. Leretha Pol   Other Screening: Last screening for diabetes:  Last lipid screening:   ADVANCE DIRECTIVES: Discussed: yes On File: no Materials Provided: yes (mailed)  Immunization status:  There is no immunization history for the selected administration types on file for this patient.   Health Maintenance Due  Topic Date Due  . DEXA SCAN  01/18/2018  . INFLUENZA VACCINE  06/07/2019     Functional Status Survey: Is the patient deaf or have difficulty hearing?: No Does the patient have difficulty seeing, even when wearing glasses/contacts?: No Does the patient have difficulty concentrating, remembering, or making decisions?: No Does the patient have difficulty walking or climbing stairs?: No Does the patient have difficulty dressing or bathing?: No Does the patient have difficulty doing errands alone such as visiting a doctor's office or shopping?: No   6CIT Screen 07/28/2019  What Year? 0 points  What month? 0 points  What time? 0 points  Count back from 20 0 points  Months in reverse 0 points  Repeat phrase 0 points  Total Score 0        Clinical Support from 07/28/2019 in [facility]  AUDIT-C Score  0       Home Environment:   Lives in a one story home No trouble climbing stairs No scattered rugs Yes  grab bars Adequate lighting/ no clutter   Patient Active Problem List   Diagnosis Date Noted  . Yeast dermatitis 08/08/2018  . Insomnia 08/08/2018  . Situational anxiety 08/08/2018  . Bilateral leg cramps 08/08/2018     Past Medical History:  Diagnosis Date  . Allergy   . Anxiety   . Arthritis      Past Surgical History:  Procedure Laterality Date  . CESAREAN SECTION       Family History  Problem Relation Age of Onset  . Diabetes Mother   . Stroke Father   . Cancer Sister   . Hypertension Sister   . Breast cancer Neg Hx      Social History   Socioeconomic History  . Marital status: Single    Spouse name: Not on file  . Number of children: Not on file  . Years of education: Not on file  . Highest education level: Not on file  Occupational History  . Not on file  Social Needs  . Financial resource strain: Not on file  . Food insecurity    Worry: Not on file    Inability: Not on file  . Transportation needs    Medical: Not on file    Non-medical: Not on file  Tobacco Use  . Smoking status: Former Smoker    Types: Cigarettes  . Smokeless tobacco: Never Used  Substance and Sexual Activity  . Alcohol use: No  . Drug use: No  . Sexual activity: Not Currently  Lifestyle  . Physical activity    Days per week: Not on file    Minutes per session: Not on file  . Stress: Not on file  Relationships  . Social Herbalist on phone: Not on file    Gets together: Not on file    Attends religious service: Not on file    Active member of club or organization: Not on file    Attends meetings of clubs or organizations: Not on file    Relationship status: Not on file  . Intimate partner violence    Fear of current or ex partner: Not on file    Emotionally abused: Not on file    Physically abused: Not on file    Forced sexual activity: Not on file  Other Topics Concern  . Not on file  Social History Narrative  . Not on file     No Known  Allergies   Prior to Admission medications   Medication Sig Start Date End Date Taking? Authorizing Provider  albuterol (VENTOLIN HFA) 108 (90 Base) MCG/ACT inhaler Inhale 2 puffs into the lungs every 6 (six) hours as needed for wheezing or shortness of breath. 07/21/19  Yes Rutherford Guys, MD  escitalopram (LEXAPRO) 20 MG tablet Take 1 tablet (20 mg total) by mouth daily. 05/02/19  Yes Rutherford Guys, MD  doxycycline (VIBRA-TABS) 100 MG tablet Take 1 tablet (100 mg total) by mouth 2 (two) times daily. 07/21/19   Rutherford Guys, MD  mirtazapine (REMERON) 7.5 MG tablet TAKE 1 TABLET BY MOUTH AT BEDTIME 05/21/19   Rutherford Guys, MD  varenicline (CHANTIX CONTINUING MONTH PAK) 1 MG tablet Take 1 tablet (1 mg total) by mouth 2 (two) times daily. Patient not taking: Reported on 07/28/2019 08/20/19   Rutherford Guys, MD  varenicline (CHANTIX STARTING MONTH PAK) 0.5 MG X 11 & 1 MG X 42 tablet Take one 0.5 mg tablet by mouth once daily for 3 days, then increase to one 0.5 mg tablet twice daily for 4 days, then increase to one 1 mg tablet twice daily. Patient not taking: Reported on 07/28/2019 07/21/19   Rutherford Guys, MD     Depression screen [facility] 2/9 07/28/2019 05/02/2019 01/16/2019 01/16/2019 08/08/2018  Decreased Interest 0 1 2 2  0  Down, Depressed, Hopeless 0 2 2 2  0  PHQ - 2 Score 0 3 4 4  0  Altered sleeping - 0 3 3 -  Tired, decreased energy - 2 3 3  -  Change in appetite - 1 2 2  -  Feeling bad or failure about yourself  - 1 1 1  -  Trouble concentrating - 1 2 2  -  Moving slowly or fidgety/restless - 0 2 2 -  Suicidal thoughts - 0 0 0 -  PHQ-9 Score - 8 17 17  -  Difficult doing work/chores - Somewhat difficult Very difficult Very difficult -     Fall Risk  07/28/2019 07/21/2019 05/02/2019 01/16/2019 07/04/2018  Falls in the past year? 0 0 0 0 No  Number falls in past yr: 0 0 0 0 -  Injury with Fall? 0 0 0 0 -  Follow up Falls evaluation completed;Education provided;Falls prevention  discussed - Falls evaluation completed - -      PHYSICAL EXAM: BP 120/78 Comment: taken from previous visit  Ht 5\' 10"  (1.778 m)   Wt 172 lb (78 kg) Comment: taken from previous visit  BMI 24.68 kg/m    Wt Readings  from Last 3 Encounters:  07/28/19 172 lb (78 kg)  07/21/19 172 lb (78 kg)  05/02/19 167 lb 6.4 oz (75.9 kg)    Medicare annual wellness visit, subsequent    Physical Exam   Education/Counseling provided regarding diet and exercise, prevention of chronic diseases, smoking/tobacco cessation, if applicable, and reviewed "Covered Medicare Preventive Services."

## 2019-07-29 ENCOUNTER — Other Ambulatory Visit: Payer: Self-pay

## 2019-07-29 DIAGNOSIS — R059 Cough, unspecified: Secondary | ICD-10-CM

## 2019-07-29 DIAGNOSIS — R05 Cough: Secondary | ICD-10-CM

## 2019-07-29 MED ORDER — BENZONATATE 100 MG PO CAPS: 100.0000 mg | ORAL_CAPSULE | Freq: Two times a day (BID) | ORAL | 0 refills | Status: DC | PRN

## 2019-07-29 NOTE — Telephone Encounter (Signed)
Rx sent to pharmacy   

## 2019-08-04 ENCOUNTER — Encounter: Payer: Self-pay | Admitting: Family Medicine

## 2019-08-04 ENCOUNTER — Other Ambulatory Visit: Payer: Self-pay

## 2019-08-04 ENCOUNTER — Ambulatory Visit (INDEPENDENT_AMBULATORY_CARE_PROVIDER_SITE_OTHER): Payer: Medicare HMO | Admitting: Family Medicine

## 2019-08-04 VITALS — BP 116/84 | HR 85 | Temp 98.6°F | Ht 70.0 in | Wt 179.8 lb

## 2019-08-04 DIAGNOSIS — R7989 Other specified abnormal findings of blood chemistry: Secondary | ICD-10-CM

## 2019-08-04 DIAGNOSIS — Z23 Encounter for immunization: Secondary | ICD-10-CM

## 2019-08-04 DIAGNOSIS — M2550 Pain in unspecified joint: Secondary | ICD-10-CM | POA: Diagnosis not present

## 2019-08-04 NOTE — Patient Instructions (Signed)
° ° ° °  If you have lab work done today you will be contacted with your lab results within the next 2 weeks.  If you have not heard from us then please contact us. The fastest way to get your results is to register for My Chart. ° ° °IF you received an x-ray today, you will receive an invoice from [facility]. Please contact [facility] at [phone] with questions or concerns regarding your invoice.  ° °IF you received labwork today, you will receive an invoice from [facility]. Please contact [facility] at [phone] with questions or concerns regarding your invoice.  ° °Our billing staff will not be able to assist you with questions regarding bills from these companies. ° °You will be contacted with the lab results as soon as they are available. The fastest way to get your results is to activate your My Chart account. Instructions are located on the last page of this paperwork. If you have not heard from us regarding the results in 2 weeks, please contact this office. °  ° ° ° °

## 2019-08-04 NOTE — Progress Notes (Signed)
9/28/20204:12 PM  Brittany Gomez 1951-06-02, 68 y.o., female [id number]  Chief Complaint  Patient presents with  . Follow-up    requesting lyme, [place] and wbc testing    HPI:   Patient is a 68 y.o. 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 Started up again, less Waiting for chantix saving card in the mail  Lab Results  Component Value Date   WBC 12.1 (A) 07/21/2019   HGB 13.9 07/21/2019   HCT 44.0 (A) 07/21/2019   MCV 91.8 07/21/2019   PLT 231 07/15/2011    Depression screen PHQ 2/9 07/28/2019 05/02/2019 01/16/2019  Decreased Interest 0 1 2  Down, Depressed, Hopeless 0 2 2  PHQ - 2 Score 0 3 4  Altered sleeping - 0 3  Tired, decreased energy - 2 3  Change in appetite - 1 2  Feeling bad or failure about yourself  - 1 1  Trouble concentrating - 1 2  Moving slowly or fidgety/restless - 0 2  Suicidal thoughts - 0 0  PHQ-9 Score - 8 17  Difficult doing work/chores - Somewhat difficult Very difficult    Fall Risk  08/04/2019 07/28/2019 07/21/2019 05/02/2019 01/16/2019  Falls in the past year? 0 0 0 0 0  Number falls in past yr: 0 0 0 0 0  Injury with Fall? 0 0 0 0 0  Follow up - Falls evaluation completed;Education provided;Falls prevention discussed - Falls evaluation completed -     No Known Allergies  Prior to Admission medications   Medication Sig Start Date End Date Taking? Authorizing Provider  albuterol (VENTOLIN HFA) 108 (90 Base) MCG/ACT inhaler Inhale 2 puffs into the lungs every 6 (six) hours as needed for wheezing or shortness of breath. 07/21/19  Yes Rutherford Guys, MD  escitalopram (LEXAPRO) 20 MG tablet Take 1 tablet (20 mg total) by mouth daily. 05/02/19  Yes Rutherford Guys, MD  mirtazapine (REMERON) 7.5 MG tablet TAKE 1 TABLET BY MOUTH AT BEDTIME 05/21/19  Yes Rutherford Guys, MD  varenicline (CHANTIX CONTINUING MONTH PAK) 1 MG tablet Take 1 tablet (1 mg total) by mouth 2 (two) times daily. Patient not taking: Reported on 08/04/2019 08/20/19   Rutherford Guys, MD    Past Medical History:  Diagnosis Date  . Allergy   . Anxiety   . Arthritis     Past Surgical History:  Procedure Laterality Date  . CESAREAN SECTION      Social History   Tobacco Use  . Smoking status: Former Smoker    Types: Cigarettes  . Smokeless tobacco: Never Used  Substance Use Topics  . Alcohol use: No    Family History  Problem Relation Age of Onset  . Diabetes Mother   . Stroke Father   . Cancer Sister   . Hypertension Sister   . Breast cancer Neg Hx     ROS Per hpi  OBJECTIVE:  Today's Vitals   08/04/19 1608  BP: 116/84  Pulse: 85  Temp: 98.6 F (37 C)  SpO2: 95%  Weight: 179 lb 12.8 oz (  81.6 kg)  Height: 5\' 10"  (1.778 m)   Body mass index is 25.8 kg/m.   Physical Exam Vitals signs and nursing note reviewed.  Constitutional:      Appearance: She is well-developed.  HENT:     Head: Normocephalic and atraumatic.  Eyes:     General: No scleral icterus.    Conjunctiva/sclera: Conjunctivae normal.     Pupils: Pupils are equal, round, and reactive to light.  Neck:     Musculoskeletal: Neck supple.  Pulmonary:     Effort: Pulmonary effort is normal.  Skin:    General: Skin is warm and dry.  Neurological:     Mental Status: She is alert and oriented to person, place, and time.     No results found for this or any previous visit (from the past 24 hour(s)).  No results found.   ASSESSMENT and PLAN  1. Multiple joint pain - Rickettsial Fever Group IgG/M - Lyme Ab/Western Blot Reflex  2. Abnormal CBC - CBC with  Differential/Platelet  3. Need for prophylactic vaccination and inoculation against influenza - Flu Vaccine QUAD High Dose(Fluad)  Return if symptoms worsen or fail to improve.    , MD [facility] at [facility] [address] [place], [place] Ph.  [phone] Fax [phone]

## 2019-08-06 LAB — LYME AB/WESTERN BLOT REFLEX
LYME DISEASE AB, QUANT, IGM: <0.8 index (ref 0.00–0.79)
Lyme IgG/IgM Ab: <0.91 {ISR} (ref 0.00–0.90)

## 2019-08-06 LAB — CBC WITH DIFFERENTIAL/PLATELET
Basophils Absolute: 0.1 10*3/uL (ref 0.0–0.2)
Basos: 1 %
EOS (ABSOLUTE): 0.1 10*3/uL (ref 0.0–0.4)
Eos: 1 %
Hematocrit: 41.7 % (ref 34.0–46.6)
Hemoglobin: 13.8 g/dL (ref 11.1–15.9)
Immature Grans (Abs): 0 10*3/uL (ref 0.0–0.1)
Immature Granulocytes: 0 %
Lymphocytes Absolute: 4.5 10*3/uL — ABNORMAL HIGH (ref 0.7–3.1)
Lymphs: 41 %
MCH: 30.9 pg (ref 26.6–33.0)
MCHC: 33.1 g/dL (ref 31.5–35.7)
MCV: 93 fL (ref 79–97)
Monocytes Absolute: 0.7 10*3/uL (ref 0.1–0.9)
Monocytes: 7 %
Neutrophils Absolute: 5.5 10*3/uL (ref 1.4–7.0)
Neutrophils: 50 %
Platelets: 341 10*3/uL (ref 150–450)
RBC: 4.47 x10E6/uL (ref 3.77–5.28)
RDW: 12.2 % (ref 11.7–15.4)
WBC: 10.9 10*3/uL — ABNORMAL HIGH (ref 3.4–10.8)

## 2019-08-06 LAB — RICKETTSIAL FEVER GROUP IGG/M
Spotted Fever Group IgG: <1:64 {titer}
Spotted Fever Group IgM: <1:64 {titer}
Typhus Fever Group IgG: <1:64 {titer}
Typhus Fever Group IgM: <1:64 {titer}

## 2019-08-27 ENCOUNTER — Telehealth: Payer: Self-pay | Admitting: Family Medicine

## 2019-08-27 NOTE — Telephone Encounter (Signed)
Pt is needing an update on paper work  that would have been faxed by [employer] . For Chantix Rx     Please advise   Please reach out to patient   FR

## 2019-08-28 ENCOUNTER — Telehealth: Payer: Self-pay

## 2019-08-28 NOTE — Telephone Encounter (Signed)
Pt is coming in to pick up forms located in the front office pick up box

## 2019-09-03 ENCOUNTER — Other Ambulatory Visit: Payer: Self-pay | Admitting: Family Medicine

## 2019-09-03 DIAGNOSIS — R05 Cough: Secondary | ICD-10-CM

## 2019-09-03 DIAGNOSIS — R059 Cough, unspecified: Secondary | ICD-10-CM

## 2019-09-03 NOTE — Telephone Encounter (Signed)
Requested medication (s) are due for refill today: yes  Requested medication (s) are on the active medication list: yes  Last refill: 07/29/2019  Future visit scheduled: yes  Notes to clinic:  Medication was discontinued    Requested Prescriptions  Pending Prescriptions Disp Refills   benzonatate (TESSALON) 100 MG capsule [Pharmacy Med Name: BENZONATATE 100MG  CAPSULES] 20 capsule 0    Sig: TAKE 1 CAPSULE(100 MG) BY MOUTH TWICE DAILY AS NEEDED FOR COUGH     Ear, Nose, and Throat:  Antitussives/Expectorants Passed - 09/03/2019  1:54 PM      Passed - Valid encounter within last 12 months    Recent Outpatient Visits          1 month ago Multiple joint pain   [facility], Lilia Argue, MD   1 month ago Medicare annual wellness visit, subsequent   [facility], Lilia Argue, MD   1 month ago COPD exacerbation [facility])   [facility], Lilia Argue, MD   4 months ago Adjustment disorder with mixed anxiety and depressed mood   [facility], Lilia Argue, MD   7 months ago Adjustment disorder with mixed anxiety and depressed mood   [facility], Lilia Argue, MD      Future Appointments            In 2 months Rutherford Guys, MD [facility] at [place], [facility]

## 2019-09-10 DIAGNOSIS — Z1211 Encounter for screening for malignant neoplasm of colon: Secondary | ICD-10-CM | POA: Diagnosis not present

## 2019-10-03 ENCOUNTER — Other Ambulatory Visit: Payer: Self-pay | Admitting: Family Medicine

## 2019-11-03 ENCOUNTER — Other Ambulatory Visit: Payer: Self-pay

## 2019-11-03 ENCOUNTER — Ambulatory Visit (INDEPENDENT_AMBULATORY_CARE_PROVIDER_SITE_OTHER): Payer: Medicare HMO | Admitting: Family Medicine

## 2019-11-03 ENCOUNTER — Encounter: Payer: Self-pay | Admitting: Family Medicine

## 2019-11-03 VITALS — BP 136/86 | HR 71 | Temp 97.2°F | Ht 70.0 in | Wt 167.2 lb

## 2019-11-03 DIAGNOSIS — F418 Other specified anxiety disorders: Secondary | ICD-10-CM | POA: Diagnosis not present

## 2019-11-03 DIAGNOSIS — F17200 Nicotine dependence, unspecified, uncomplicated: Secondary | ICD-10-CM

## 2019-11-03 DIAGNOSIS — G43109 Migraine with aura, not intractable, without status migrainosus: Secondary | ICD-10-CM

## 2019-11-03 DIAGNOSIS — G4700 Insomnia, unspecified: Secondary | ICD-10-CM | POA: Diagnosis not present

## 2019-11-03 DIAGNOSIS — M5431 Sciatica, right side: Secondary | ICD-10-CM

## 2019-11-03 MED ORDER — SUMATRIPTAN SUCCINATE 25 MG PO TABS: 25.0000 mg | ORAL_TABLET | Freq: Every day | ORAL | 3 refills | Status: DC | PRN

## 2019-11-03 MED ORDER — BENZONATATE 100 MG PO CAPS: 100.0000 mg | ORAL_CAPSULE | Freq: Three times a day (TID) | ORAL | 2 refills | Status: DC | PRN

## 2019-11-03 MED ORDER — TOPIRAMATE 25 MG PO TABS: ORAL_TABLET | ORAL | 0 refills | Status: DC

## 2019-11-03 MED ORDER — GABAPENTIN 300 MG PO CAPS: 300.0000 mg | ORAL_CAPSULE | Freq: Three times a day (TID) | ORAL | 3 refills | Status: DC

## 2019-11-03 NOTE — Patient Instructions (Signed)
° ° ° °  If you have lab work done today you will be contacted with your lab results within the next 2 weeks.  If you have not heard from us then please contact us. The fastest way to get your results is to register for My Chart. ° ° °IF you received an x-ray today, you will receive an invoice from [facility]. Please contact [facility] at [phone] with questions or concerns regarding your invoice.  ° °IF you received labwork today, you will receive an invoice from [facility]. Please contact [facility] at [phone] with questions or concerns regarding your invoice.  ° °Our billing staff will not be able to assist you with questions regarding bills from these companies. ° °You will be contacted with the lab results as soon as they are available. The fastest way to get your results is to activate your My Chart account. Instructions are located on the last page of this paperwork. If you have not heard from us regarding the results in 2 weeks, please contact this office. °  ° ° ° °
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
## 2019-11-29 ENCOUNTER — Other Ambulatory Visit: Payer: Self-pay | Admitting: Family Medicine

## 2019-11-29 NOTE — Telephone Encounter (Signed)
Requested medication (s) are due for refill today: yes  Requested medication (s) are on the active medication list: yes- but have expired  Last refill:  yes both requested meds have expired  Future visit scheduled: no  Notes to clinic:  both medication have expired   Requested Prescriptions  Pending Prescriptions Disp Refills   topiramate (TOPAMAX) 25 MG tablet [Pharmacy Med Name: TOPIRAMATE 25MG  TABLETS] 42 tablet 0    Sig: TAKE 1 TABLET(25 MG) BY MOUTH AT BEDTIME FOR 14 DAYS THEN TAKE 2 TABLETS(50 MG) BY MOUTH AT BEDTIME FOR 14 DAYS      Not Delegated - Neurology: Anticonvulsants - topiramate & zonisamide Failed - 11/29/2019 10:19 AM      Failed - This refill cannot be delegated      Failed - Cr in normal range and within 360 days    Creatinine, Ser  Date Value Ref Range Status  07/15/2011 0.77 0.50 - 1.10 mg/dL Final          Failed - CO2 in normal range and within 360 days    CO2  Date Value Ref Range Status  07/15/2011 29 19 - 32 mEq/L Final          Passed - Valid encounter within last 12 months    Recent Outpatient Visits           3 weeks ago Situational anxiety   Primary Care at 09/14/2011, Oneita Jolly, MD   3 months ago Multiple joint pain   [facility] at [facility], Oneita Jolly, MD   4 months ago Medicare annual wellness visit, subsequent   [facility] at [facility], Oneita Jolly, MD   4 months ago COPD exacerbation [facility])   Primary Care at [facility], Oneita Jolly, MD   7 months ago Adjustment disorder with mixed anxiety and depressed mood   [facility] at [facility], Oneita Jolly, MD                mirtazapine (REMERON) 15 MG tablet [Pharmacy Med Name: MIRTAZAPINE 15MG  TABLETS] 30 tablet 2    Sig: TAKE 1 TABLET BY MOUTH AT BEDTIME      Psychiatry: Antidepressants - mirtazapine Failed - 11/29/2019 10:19 AM      Failed - AST in normal range and within 360 days    AST  Date Value Ref Range Status  07/15/2011 13 0 - 37 U/L Final         Failed - ALT in normal range and within 360 days    ALT  Date Value Ref Range Status  07/15/2011 7 0 - 35 U/L Final          Failed - Triglycerides in normal range and within 360 days    Triglycerides  Date Value Ref Range Status  04/12/2007 84  Final          Failed - Total Cholesterol in normal range and within 360 days    Cholesterol  Date Value Ref Range Status  04/12/2007 (H)  Final   216        ATP III CLASSIFICATION:  <200     mg/dL   Desirable  06/12/2007  mg/dL   Borderline High  06/12/2007    mg/dL   High          Failed - WBC in normal range and within 360 days    WBC  Date Value Ref Range Status  08/04/2019 10.9 (H) 3.4 - 10.8 x10E3/uL Final  07/21/2019  12.1 (A) 4.6 - 10.2 K/uL Final  07/15/2011 16.6 (H) 4.0 - 10.5 K/uL Final          Passed - Valid encounter within last 6 months    Recent Outpatient Visits           3 weeks ago Situational anxiety   [facility], Lilia Argue, MD   3 months ago Multiple joint pain   [facility], Lilia Argue, MD   4 months ago Medicare annual wellness visit, subsequent   [facility], Lilia Argue, MD   4 months ago COPD exacerbation [facility])   [facility] at [facility], Lilia Argue, MD   7 months ago Adjustment disorder with mixed anxiety and depressed mood   [facility], Lilia Argue, MD

## 2019-11-29 NOTE — Telephone Encounter (Signed)
Requested Prescriptions  Pending Prescriptions Disp Refills  . escitalopram (LEXAPRO) 20 MG tablet [Pharmacy Med Name: ESCITALOPRAM 20MG  TABLETS] 90 tablet 1    Sig: TAKE 1 TABLET BY MOUTH DAILY     Psychiatry:  Antidepressants - SSRI Passed - 11/29/2019 11:03 AM      Passed - Valid encounter within last 6 months    Recent Outpatient Visits          3 weeks ago Situational anxiety   Primary Care at 12/01/2019, Oneita Jolly, MD   3 months ago Multiple joint pain   [facility] at [facility], Oneita Jolly, MD   4 months ago Medicare annual wellness visit, subsequent   [facility] at [facility], Oneita Jolly, MD   4 months ago COPD exacerbation [facility])   Primary Care at [facility], Oneita Jolly, MD   7 months ago Adjustment disorder with mixed anxiety and depressed mood   Primary Care at [facility], Oneita Jolly, MD

## 2019-12-02 ENCOUNTER — Ambulatory Visit: Payer: Medicare HMO | Admitting: Family Medicine

## 2019-12-03 NOTE — Telephone Encounter (Signed)
Patient is requesting a refill of the following medications: Requested Prescriptions   Pending Prescriptions Disp Refills   topiramate (TOPAMAX) 25 MG tablet [Pharmacy Med Name: TOPIRAMATE 25MG  TABLETS] 42 tablet 0    Sig: TAKE 1 TABLET(25 MG) BY MOUTH AT BEDTIME FOR 14 DAYS THEN TAKE 2 TABLETS(50 MG) BY MOUTH AT BEDTIME FOR 14 DAYS   mirtazapine (REMERON) 15 MG tablet [Pharmacy Med Name: MIRTAZAPINE 15MG  TABLETS] 30 tablet 2    Sig: TAKE 1 TABLET BY MOUTH AT BEDTIME    Date of patient request: 11/29/19 Last office visit: 11/03/19 Date of last refill: 11/03/19 Last refill amount: 42 orf Follow up time period per chart: none

## 2020-01-04 ENCOUNTER — Ambulatory Visit: Payer: Medicare HMO | Attending: Internal Medicine

## 2020-01-04 DIAGNOSIS — Z23 Encounter for immunization: Secondary | ICD-10-CM | POA: Insufficient documentation

## 2020-01-04 NOTE — Progress Notes (Signed)
   Covid-19 Vaccination Clinic  Name:  Brittany Gomez    MRN: [id number] DOB: [birth year]  01/04/2020  Brittany Gomez was observed post Covid-19 immunization for 15 minutes without incidence. She was provided with Vaccine Information Sheet and instruction to access the V-Safe system.   Brittany Gomez was instructed to call 911 with any severe reactions post vaccine: Marland Kitchen Difficulty breathing  . Swelling of your face and throat  . A fast heartbeat  . A bad rash all over your body  . Dizziness and weakness    Immunizations Administered    Name Date Dose VIS Date Route   Pfizer COVID-19 Vaccine 01/04/2020  2:39 PM 0.3 mL 10/17/2019 Intramuscular   Manufacturer: [employer], [employer]   Lot: [id number]   NDC: [address]-2

## 2020-02-03 ENCOUNTER — Ambulatory Visit: Payer: Medicare HMO | Attending: Internal Medicine

## 2020-02-03 DIAGNOSIS — Z23 Encounter for immunization: Secondary | ICD-10-CM

## 2020-02-03 NOTE — Progress Notes (Signed)
   Covid-19 Vaccination Clinic  Name:  Brittany Gomez    MRN: [id number] DOB: [birth year]  02/03/2020  [name] was observed post Covid-19 immunization for 15 minutes without incident. She was provided with Vaccine Information Sheet and instruction to access the V-Safe system.   [name] was instructed to call 911 with any severe reactions post vaccine: Marland Kitchen Difficulty breathing  . Swelling of face and throat  . A fast heartbeat  . A bad rash all over body  . Dizziness and weakness   Immunizations Administered    Name Date Dose VIS Date Route   Pfizer COVID-19 Vaccine 02/03/2020 10:12 AM 0.3 mL 10/17/2019 Intramuscular   Manufacturer: [employer], [employer]   Lot: [id number]   NDC: [address]-3

## 2020-02-16 ENCOUNTER — Other Ambulatory Visit: Payer: Self-pay | Admitting: Family Medicine

## 2020-02-16 DIAGNOSIS — Z1231 Encounter for screening mammogram for malignant neoplasm of breast: Secondary | ICD-10-CM

## 2020-02-19 ENCOUNTER — Other Ambulatory Visit: Payer: Self-pay | Admitting: Family Medicine

## 2020-02-19 NOTE — Telephone Encounter (Signed)
Refill refused. Last prescription was for 15mg  on 12/04/19 #30 with 2 refills

## 2020-04-06 ENCOUNTER — Other Ambulatory Visit: Payer: Self-pay

## 2020-04-06 ENCOUNTER — Encounter: Payer: Self-pay | Admitting: Registered Nurse

## 2020-04-06 ENCOUNTER — Ambulatory Visit (INDEPENDENT_AMBULATORY_CARE_PROVIDER_SITE_OTHER): Payer: Medicare HMO | Admitting: Registered Nurse

## 2020-04-06 VITALS — BP 138/87 | HR 74 | Temp 97.7°F | Resp 17 | Ht 70.0 in | Wt 178.0 lb

## 2020-04-06 DIAGNOSIS — Z716 Tobacco abuse counseling: Secondary | ICD-10-CM | POA: Insufficient documentation

## 2020-04-06 DIAGNOSIS — M722 Plantar fascial fibromatosis: Secondary | ICD-10-CM | POA: Diagnosis not present

## 2020-04-06 MED ORDER — VARENICLINE TARTRATE 0.5 MG X 11 & 1 MG X 42 PO MISC: ORAL | 0 refills | Status: DC

## 2020-04-06 MED ORDER — VARENICLINE TARTRATE 1 MG PO TABS: 1.0000 mg | ORAL_TABLET | Freq: Two times a day (BID) | ORAL | 0 refills | Status: DC

## 2020-04-06 MED ORDER — DICLOFENAC SODIUM 75 MG PO TBEC: 75.0000 mg | DELAYED_RELEASE_TABLET | Freq: Two times a day (BID) | ORAL | 0 refills | Status: DC

## 2020-04-06 NOTE — Progress Notes (Signed)
Acute Office Visit  Subjective:    Patient ID: Brittany Gomez, female    DOB: 10-Jul-1951, 69 y.o.   MRN: [id number]  Chief Complaint  Patient presents with  . Foot Swelling    patient states left foot has been swollen for a week she states she changed her shoes but it felt like her heels where being poked by needles. Per patient she has been taking tylenol , elevating foot , ice and heat and it still seems to hurt    HPI Patient is in today for L foot pain and swelling Ongoing for a few weeks, steady, not worsening or improving. Sharp pain when stepping on that foot. Worst in mornings, worse with bare feet. Has been on her feet a lot more lately. No acute injury noted. Swelling is mild, not terrible noticeable.   No other concerns. Denies CV symptoms, erythema, pitting  Past Medical History:  Diagnosis Date  . Allergy   . Anxiety   . Arthritis     Past Surgical History:  Procedure Laterality Date  . CESAREAN SECTION      Family History  Problem Relation Age of Onset  . Diabetes Mother   . Stroke Father   . Cancer Sister   . Hypertension Sister   . Breast cancer Neg Hx     Social History   Socioeconomic History  . Marital status: Single    Spouse name: Not on file  . Number of children: Not on file  . Years of education: Not on file  . Highest education level: Not on file  Occupational History  . Not on file  Tobacco Use  . Smoking status: Former Smoker    Types: Cigarettes  . Smokeless tobacco: Never Used  Substance and Sexual Activity  . Alcohol use: No  . Drug use: No  . Sexual activity: Not Currently  Other Topics Concern  . Not on file  Social History Narrative  . Not on file   Social Determinants of Health   Financial Resource Strain:   . Difficulty of Paying Living Expenses:   Food Insecurity:   . Worried About Programme researcher, broadcasting/film/video in the Last Year:   . Barista in the Last Year:   Transportation Needs:   . Automotive engineer (Medical):   Marland Kitchen Lack of Transportation (Non-Medical):   Physical Activity:   . Days of Exercise per Week:   . Minutes of Exercise per Session:   Stress:   . Feeling of Stress :   Social Connections:   . Frequency of Communication with Friends and Family:   . Frequency of Social Gatherings with Friends and Family:   . Attends Religious Services:   . Active Member of Clubs or Organizations:   . Attends Banker Meetings:   Marland Kitchen Marital Status:   Intimate Partner Violence:   . Fear of Current or Ex-Partner:   . Emotionally Abused:   Marland Kitchen Physically Abused:   . Sexually Abused:     Outpatient Medications Prior to Visit  Medication Sig Dispense Refill  . escitalopram (LEXAPRO) 20 MG tablet TAKE 1 TABLET BY MOUTH DAILY 90 tablet 1  . mirtazapine (REMERON) 15 MG tablet TAKE 1 TABLET BY MOUTH AT BEDTIME 30 tablet 2  . albuterol (VENTOLIN HFA) 108 (90 Base) MCG/ACT inhaler Inhale 2 puffs into the lungs every 6 (six) hours as needed for wheezing or shortness of breath. (Patient not taking: Reported on 04/06/2020) 8 g  2  . benzonatate (TESSALON) 100 MG capsule Take 1-2 capsules (100-200 mg total) by mouth 3 (three) times daily as needed for cough. (Patient not taking: Reported on 04/06/2020) 40 capsule 2  . gabapentin (NEURONTIN) 300 MG capsule Take 1 capsule (300 mg total) by mouth 3 (three) times daily. (Patient not taking: Reported on 04/06/2020) 90 capsule 3  . levocetirizine (XYZAL) 5 MG tablet Take 5 mg by mouth daily.    . SUMAtriptan (IMITREX) 25 MG tablet Take 1 tablet (25 mg total) by mouth daily as needed for migraine. May repeat in 2 hours if headache persists or recurs. (Patient not taking: Reported on 04/06/2020) 10 tablet 3  . topiramate (TOPAMAX) 50 MG tablet Take 1 tablet (50 mg total) by mouth at bedtime. (Patient not taking: Reported on 04/06/2020) 30 tablet 3   No facility-administered medications prior to visit.    No Known Allergies  Review of Systems    Constitutional: Negative.   HENT: Negative.   Eyes: Negative.   Respiratory: Negative.   Cardiovascular: Negative.   Gastrointestinal: Negative.   Endocrine: Negative.   Genitourinary: Negative.   Musculoskeletal: Positive for arthralgias. Negative for back pain, gait problem, joint swelling, myalgias, neck pain and neck stiffness.  Skin: Negative.   Allergic/Immunologic: Negative.   Neurological: Negative.   Hematological: Negative.   Psychiatric/Behavioral: Negative.   All other systems reviewed and are negative.      Objective:    Physical Exam Vitals and nursing note reviewed.  Constitutional:      General: She is not in acute distress.    Appearance: Normal appearance. She is normal weight. She is not ill-appearing, toxic-appearing or diaphoretic.  Cardiovascular:     Rate and Rhythm: Normal rate and regular rhythm.     Pulses: Normal pulses.  Pulmonary:     Effort: Pulmonary effort is normal. No respiratory distress.  Musculoskeletal:        General: Tenderness present. No swelling, deformity or signs of injury. Normal range of motion.     Right lower leg: No edema.     Left lower leg: No edema.       Feet:  Skin:    General: Skin is warm and dry.     Capillary Refill: Capillary refill takes less than 2 seconds.  Neurological:     General: No focal deficit present.     Mental Status: She is alert and oriented to person, place, and time. Mental status is at baseline.     Cranial Nerves: No cranial nerve deficit.     Sensory: No sensory deficit.     Motor: No weakness.     Gait: Gait normal.     Deep Tendon Reflexes: Reflexes normal.  Psychiatric:        Mood and Affect: Mood normal.        Behavior: Behavior normal.        Thought Content: Thought content normal.        Judgment: Judgment normal.     BP 138/87   Pulse 74   Temp 97.7 F (36.5 C) (Temporal)   Resp 17   Ht 5\' 10"  (1.778 m)   Wt 178 lb (80.7 kg)   SpO2 90%   BMI 25.54 kg/m  Wt  Readings from Last 3 Encounters:  04/06/20 178 lb (80.7 kg)  11/03/19 167 lb 3.2 oz (75.8 kg)  08/04/19 179 lb 12.8 oz (81.6 kg)    Health Maintenance Due  Topic Date Due  .  DEXA SCAN  Never done    There are no preventive care reminders to display for this patient.    Lab Results  Component Value Date   WBC 10.9 (H) 08/04/2019   HGB 13.8 08/04/2019   HCT 41.7 08/04/2019   MCV 93 08/04/2019   PLT 341 08/04/2019   Lab Results  Component Value Date   NA 137 07/15/2011   K 4.0 07/15/2011   CO2 29 07/15/2011   GLUCOSE 90 07/15/2011   BUN 8 07/15/2011   CREATININE 0.77 07/15/2011   BILITOT 0.4 07/15/2011   ALKPHOS 71 07/15/2011   AST 13 07/15/2011   ALT 7 07/15/2011   PROT 6.7 07/15/2011   ALBUMIN 3.6 07/15/2011   CALCIUM 9.4 07/15/2011   Lab Results  Component Value Date   CHOL (H) 04/12/2007    216        ATP III CLASSIFICATION:  <200     mg/dL   Desirable  299-242  mg/dL   Borderline High  >=683    mg/dL   High   Lab Results  Component Value Date   HDL 45 04/12/2007   Lab Results  Component Value Date   LDLCALC (H) 04/12/2007    154        Total Cholesterol/HDL:CHD Risk Coronary Heart Disease Risk Table                     Men   Women  1/2 Average Risk   3.4   3.3   Lab Results  Component Value Date   TRIG 84 04/12/2007   Lab Results  Component Value Date   CHOLHDL 4.8 04/12/2007   No results found for: HGBA1C     Assessment & Plan:   Problem List Items Addressed This Visit    None    Visit Diagnoses    Plantar fasciitis, left    -  Primary   Relevant Medications   diclofenac (VOLTAREN) 75 MG EC tablet   Other Relevant Orders   Ambulatory referral to Physical Therapy   Encounter for smoking cessation counseling       Relevant Medications   varenicline (CHANTIX PAK) 0.5 MG X 11 & 1 MG X 42 tablet   varenicline (CHANTIX) 1 MG tablet       Meds ordered this encounter  Medications  . diclofenac (VOLTAREN) 75 MG EC tablet    Sig:  Take 1 tablet (75 mg total) by mouth 2 (two) times daily.    Dispense:  30 tablet    Refill:  0    Order Specific Question:   Supervising Provider    Answer:   Neva Seat, JEFFREY R [[id number]]  . varenicline (CHANTIX PAK) 0.5 MG X 11 & 1 MG X 42 tablet    Sig: Take one 0.5 mg tablet by mouth once daily for 3 days, then increase to one 0.5 mg tablet twice daily for 4 days, then increase to one 1 mg tablet twice daily.    Dispense:  53 tablet    Refill:  0    Order Specific Question:   Supervising Provider    Answer:   Neva Seat, JEFFREY R [[id number]]  . varenicline (CHANTIX) 1 MG tablet    Sig: Take 1 tablet (1 mg total) by mouth 2 (two) times daily.    Dispense:  60 tablet    Refill:  0    Order Specific Question:   Supervising Provider    Answer:   Shade Flood [  [id number]]   PLAN  Presents as plantar fasciitis of L foot. Suggest wearing shoes all the time, RICE, heat  Diclofenac twice daily prn  Refer to PT  Replaced order for Chantix. Discussed smoking cessation with patient who demonstrates understanding of process.  Patient encouraged to call clinic with any questions, comments, or concerns.  Maximiano Coss, NP

## 2020-04-06 NOTE — Patient Instructions (Signed)
° ° ° °  If you have lab work done today you will be contacted with your lab results within the next 2 weeks.  If you have not heard from us then please contact us. The fastest way to get your results is to register for My Chart. ° ° °IF you received an x-ray today, you will receive an invoice from [facility]. Please contact [facility] at [phone] with questions or concerns regarding your invoice.  ° °IF you received labwork today, you will receive an invoice from [facility]. Please contact [facility] at [phone] with questions or concerns regarding your invoice.  ° °Our billing staff will not be able to assist you with questions regarding bills from these companies. ° °You will be contacted with the lab results as soon as they are available. The fastest way to get your results is to activate your My Chart account. Instructions are located on the last page of this paperwork. If you have not heard from us regarding the results in 2 weeks, please contact this office. °  ° ° ° °

## 2020-04-26 ENCOUNTER — Other Ambulatory Visit: Payer: Self-pay | Admitting: Family Medicine

## 2020-04-26 NOTE — Telephone Encounter (Signed)
Requested Prescriptions  Pending Prescriptions Disp Refills   mirtazapine (REMERON) 15 MG tablet [Pharmacy Med Name: MIRTAZAPINE 15MG  TABLETS] 30 tablet 2    Sig: TAKE 1 TABLET BY MOUTH AT BEDTIME     Psychiatry: Antidepressants - mirtazapine Failed - 04/26/2020  3:17 PM      Failed - AST in normal range and within 360 days    AST  Date Value Ref Range Status  07/15/2011 13 0 - 37 U/L Final         Failed - ALT in normal range and within 360 days    ALT  Date Value Ref Range Status  07/15/2011 7 0 - 35 U/L Final         Failed - Triglycerides in normal range and within 360 days    Triglycerides  Date Value Ref Range Status  04/12/2007 84  Final         Failed - Total Cholesterol in normal range and within 360 days    Cholesterol  Date Value Ref Range Status  04/12/2007 (H)  Final   216        ATP III CLASSIFICATION:  <200     mg/dL   Desirable  06/12/2007  mg/dL   Borderline High  283-662    mg/dL   High         Failed - WBC in normal range and within 360 days    WBC  Date Value Ref Range Status  08/04/2019 10.9 (H) 3.4 - 10.8 x10E3/uL Final  07/21/2019 12.1 (A) 4.6 - 10.2 K/uL Final  07/15/2011 16.6 (H) 4.0 - 10.5 K/uL Final         Passed - Valid encounter within last 6 months    Recent Outpatient Visits          2 weeks ago Plantar fasciitis, left   Primary Care at 09/14/2011, Shelbie Ammons, NP   5 months ago Situational anxiety   [facility] at [facility], Oneita Jolly, MD   8 months ago Multiple joint pain   [facility], Oneita Jolly, MD   9 months ago Medicare annual wellness visit, subsequent   [facility], Oneita Jolly, MD   9 months ago COPD exacerbation [facility])   Primary Care at [facility], Oneita Jolly, MD      Future Appointments            In 1 week [facility], MD [facility] at [place], [facility]

## 2020-05-07 ENCOUNTER — Other Ambulatory Visit: Payer: Self-pay

## 2020-05-07 ENCOUNTER — Encounter: Payer: Self-pay | Admitting: Family Medicine

## 2020-05-07 ENCOUNTER — Ambulatory Visit (INDEPENDENT_AMBULATORY_CARE_PROVIDER_SITE_OTHER): Payer: Medicare HMO

## 2020-05-07 ENCOUNTER — Ambulatory Visit (INDEPENDENT_AMBULATORY_CARE_PROVIDER_SITE_OTHER): Payer: Medicare HMO | Admitting: Family Medicine

## 2020-05-07 VITALS — BP 115/79 | HR 87 | Temp 98.7°F | Ht 70.0 in | Wt 173.6 lb

## 2020-05-07 DIAGNOSIS — M722 Plantar fascial fibromatosis: Secondary | ICD-10-CM | POA: Diagnosis not present

## 2020-05-07 DIAGNOSIS — M79672 Pain in left foot: Secondary | ICD-10-CM | POA: Diagnosis not present

## 2020-05-07 DIAGNOSIS — M2012 Hallux valgus (acquired), left foot: Secondary | ICD-10-CM | POA: Diagnosis not present

## 2020-05-07 DIAGNOSIS — G8929 Other chronic pain: Secondary | ICD-10-CM | POA: Diagnosis not present

## 2020-05-07 DIAGNOSIS — M19072 Primary osteoarthritis, left ankle and foot: Secondary | ICD-10-CM | POA: Diagnosis not present

## 2020-05-07 MED ORDER — TRAMADOL HCL 50 MG PO TABS: 50.0000 mg | ORAL_TABLET | Freq: Three times a day (TID) | ORAL | 0 refills | Status: AC | PRN

## 2020-05-07 NOTE — Progress Notes (Signed)
7/2/20212:57 PM  Brittany Gomez 1951/03/06, 69 y.o., female [id number]  Chief Complaint  Patient presents with  . Pain    plantar pain in the feet. has handicapped form for renewal. Asking for a stronger med for the pain.  Marland Kitchen Elbow Problem    has lump in the left elbow that she is concerned about, however it has gone down    HPI:   Patient is a 69 y.o. female with past medical history significant for COPD, tobacco use, anxiety and insomnia who presents today for followup on plantar fasicitis  Gloris Manchester, NP a month ago, dx with plantar fascitis left, rx diclofenac and referred to PT Doing epson salts soaks, ice packs, tylenol, rolling frozen water bottle Despite these measures barely able to walk due to pain Denies any trauma  Depression screen [facility] 2/9 04/06/2020 11/03/2019 07/28/2019  Decreased Interest 0 1 0  Down, Depressed, Hopeless 0 1 0  PHQ - 2 Score 0 2 0  Altered sleeping - 2 -  Tired, decreased energy - 2 -  Change in appetite - 2 -  Feeling bad or failure about yourself  - 1 -  Trouble concentrating - 1 -  Moving slowly or fidgety/restless - 0 -  Suicidal thoughts - 0 -  PHQ-9 Score - 10 -  Difficult doing work/chores - Somewhat difficult -    Fall Risk  05/07/2020 04/06/2020 11/03/2019 08/04/2019 07/28/2019  Falls in the past year? 0 0 0 0 0  Number falls in past yr: 0 0 0 0 0  Injury with Fall? 0 0 0 0 0  Risk for fall due to : Impaired balance/gait - - - -  Follow up - Falls evaluation completed - - Falls evaluation completed;Education provided;Falls prevention discussed     No Known Allergies  Prior to Admission medications   Medication Sig Start Date End Date Taking? Authorizing Provider  albuterol (VENTOLIN HFA) 108 (90 Base) MCG/ACT inhaler Inhale 2 puffs into the lungs every 6 (six) hours as needed for wheezing or shortness of breath. 07/21/19  Yes Myles Lipps, MD  diclofenac (VOLTAREN) 75 MG EC tablet Take 1 tablet (75 mg total) by mouth 2  (two) times daily. 04/06/20  Yes Janeece Agee, NP  escitalopram (LEXAPRO) 20 MG tablet TAKE 1 TABLET BY MOUTH DAILY 11/29/19  Yes Myles Lipps, MD  gabapentin (NEURONTIN) 300 MG capsule Take 1 capsule (300 mg total) by mouth 3 (three) times daily. 11/03/19  Yes Myles Lipps, MD  levocetirizine (XYZAL) 5 MG tablet Take 5 mg by mouth daily. 10/03/19  Yes [provider]  mirtazapine (REMERON) 15 MG tablet TAKE 1 TABLET BY MOUTH AT BEDTIME 04/26/20  Yes Myles Lipps, MD  SUMAtriptan (IMITREX) 25 MG tablet Take 1 tablet (25 mg total) by mouth daily as needed for migraine. May repeat in 2 hours if headache persists or recurs. 11/03/19  Yes Myles Lipps, MD  topiramate (TOPAMAX) 50 MG tablet Take 1 tablet (50 mg total) by mouth at bedtime. 12/04/19  Yes Myles Lipps, MD  varenicline (CHANTIX PAK) 0.5 MG X 11 & 1 MG X 42 tablet Take one 0.5 mg tablet by mouth once daily for 3 days, then increase to one 0.5 mg tablet twice daily for 4 days, then increase to one 1 mg tablet twice daily. 04/06/20  Yes Janeece Agee, NP  varenicline (CHANTIX) 1 MG tablet Take 1 tablet (1 mg total) by mouth 2 (two) times daily. 04/06/20  Yes Janeece Agee, NP    Past Medical History:  Diagnosis Date  . Allergy   . Anxiety   . Arthritis     Past Surgical History:  Procedure Laterality Date  . CESAREAN SECTION      Social History   Tobacco Use  . Smoking status: Former Smoker    Types: Cigarettes  . Smokeless tobacco: Never Used  Substance Use Topics  . Alcohol use: No    Family History  Problem Relation Age of Onset  . Diabetes Mother   . Stroke Father   . Cancer Sister   . Hypertension Sister   . Breast cancer Neg Hx     ROS Per hpi  OBJECTIVE:  Today's Vitals   05/07/20 1431  BP: 115/79  Pulse: 87  Temp: 98.7 F (37.1 C)  SpO2: 94%  Weight: 173 lb 9.6 oz (78.7 kg)  Height: 5\' 10"  (1.778 m)   Body mass index is 24.91 kg/m.   Physical Exam Vitals and nursing  note reviewed.  Constitutional:      Appearance: She is well-developed.  HENT:     Head: Normocephalic and atraumatic.  Eyes:     General: No scleral icterus.    Conjunctiva/sclera: Conjunctivae normal.     Pupils: Pupils are equal, round, and reactive to light.  Pulmonary:     Effort: Pulmonary effort is normal.  Musculoskeletal:     Cervical back: Neck supple.     Left ankle: No swelling or deformity. No tenderness. Normal range of motion. Normal pulse.     Left Achilles Tendon: Tenderness present.     Left foot: Normal range of motion. Tenderness (mid heel extends to mid foot) present. No swelling or deformity. Normal pulse.  Skin:    General: Skin is warm and dry.  Neurological:     Mental Status: She is alert and oriented to person, place, and time.       No results found for this or any previous visit (from the past 24 hour(s)).  DG Foot 2 Views Left  Result Date: 05/07/2020 CLINICAL DATA:  Chronic heel pain. EXAM: LEFT FOOT - 2 VIEW COMPARISON:  None. FINDINGS: No acute fracture or dislocation. Mild hallux valgus deformity and first MTP joint degenerative changes. Joint spaces are preserved. Bone mineralization is normal. Plantar enthesophyte. Soft tissues are unremarkable. IMPRESSION: 1. Mild hallux valgus deformity and first MTP joint degenerative changes. Electronically Signed   By: 07/08/2020 M.D.   On: 05/07/2020 15:30     ASSESSMENT and PLAN  1. Plantar fasciitis, left 2. Pain of left heel Discussed supportive measures, new meds r/se/b and RTC precautions. pmp reviewed - DG Foot 2 Views Left - Ambulatory referral to Podiatry  Other orders - traMADol (ULTRAM) 50 MG tablet; Take 1 tablet (50 mg total) by mouth every 8 (eight) hours as needed for up to 5 days.  Return if symptoms worsen or fail to improve.    07/08/2020, MD Primary Care at [facility] [address] [place], [place] Ph.  [phone] Fax [phone]

## 2020-05-07 NOTE — Patient Instructions (Signed)
° ° ° °  If you have lab work done today you will be contacted with your lab results within the next 2 weeks.  If you have not heard from us then please contact us. The fastest way to get your results is to register for My Chart. ° ° °IF you received an x-ray today, you will receive an invoice from [facility]. Please contact [facility] at [phone] with questions or concerns regarding your invoice.  ° °IF you received labwork today, you will receive an invoice from [facility]. Please contact [facility] at [phone] with questions or concerns regarding your invoice.  ° °Our billing staff will not be able to assist you with questions regarding bills from these companies. ° °You will be contacted with the lab results as soon as they are available. The fastest way to get your results is to activate your My Chart account. Instructions are located on the last page of this paperwork. If you have not heard from us regarding the results in 2 weeks, please contact this office. °  ° ° ° °

## 2020-06-01 ENCOUNTER — Ambulatory Visit (INDEPENDENT_AMBULATORY_CARE_PROVIDER_SITE_OTHER): Payer: Medicare HMO

## 2020-06-01 ENCOUNTER — Ambulatory Visit: Payer: Medicare HMO | Admitting: Podiatry

## 2020-06-01 ENCOUNTER — Encounter: Payer: Self-pay | Admitting: Podiatry

## 2020-06-01 ENCOUNTER — Other Ambulatory Visit: Payer: Self-pay | Admitting: Podiatry

## 2020-06-01 ENCOUNTER — Other Ambulatory Visit: Payer: Self-pay

## 2020-06-01 VITALS — BP 146/94 | HR 88 | Temp 97.2°F | Resp 16

## 2020-06-01 DIAGNOSIS — M7662 Achilles tendinitis, left leg: Secondary | ICD-10-CM

## 2020-06-01 DIAGNOSIS — M216X2 Other acquired deformities of left foot: Secondary | ICD-10-CM

## 2020-06-01 DIAGNOSIS — M79672 Pain in left foot: Secondary | ICD-10-CM | POA: Diagnosis not present

## 2020-06-01 DIAGNOSIS — M722 Plantar fascial fibromatosis: Secondary | ICD-10-CM | POA: Diagnosis not present

## 2020-06-01 MED ORDER — DICLOFENAC SODIUM 75 MG PO TBEC: 75.0000 mg | DELAYED_RELEASE_TABLET | Freq: Two times a day (BID) | ORAL | 0 refills | Status: DC

## 2020-06-01 NOTE — Patient Instructions (Addendum)
Get over the counter Voltaren gel (also diclofenac) and apply to back of heel 3-4 times daily  Rest for 2 weeks before starting exercises. If they make you feel worse, rest for 2 more weeks  Plantar Fasciitis (Heel Spur Syndrome) with Rehab The plantar fascia is a fibrous, ligament-like, soft-tissue structure that spans the bottom of the foot. Plantar fasciitis is a condition that causes pain in the foot due to inflammation of the tissue. SYMPTOMS   Pain and tenderness on the underneath side of the foot.  Pain that worsens with standing or walking. CAUSES  Plantar fasciitis is caused by irritation and injury to the plantar fascia on the underneath side of the foot. Common mechanisms of injury include:  Direct trauma to bottom of the foot.  Damage to a small nerve that runs under the foot where the main fascia attaches to the heel bone.  Stress placed on the plantar fascia due to bone spurs. RISK INCREASES WITH:   Activities that place stress on the plantar fascia (running, jumping, pivoting, or cutting).  Poor strength and flexibility.  Improperly fitted shoes.  Tight calf muscles.  Flat feet.  Failure to warm-up properly before activity.  Obesity. PREVENTION  Warm up and stretch properly before activity.  Allow for adequate recovery between workouts.  Maintain physical fitness:  Strength, flexibility, and endurance.  Cardiovascular fitness.  Maintain a health body weight.  Avoid stress on the plantar fascia.  Wear properly fitted shoes, including arch supports for individuals who have flat feet.  PROGNOSIS  If treated properly, then the symptoms of plantar fasciitis usually resolve without surgery. However, occasionally surgery is necessary.  RELATED COMPLICATIONS   Recurrent symptoms that may result in a chronic condition.  Problems of the lower back that are caused by compensating for the injury, such as limping.  Pain or weakness of the foot during  push-off following surgery.  Chronic inflammation, scarring, and partial or complete fascia tear, occurring more often from repeated injections.  TREATMENT  Treatment initially involves the use of ice and medication to help reduce pain and inflammation. The use of strengthening and stretching exercises may help reduce pain with activity, especially stretches of the Achilles tendon. These exercises may be performed at home or with a therapist. Your caregiver may recommend that you use heel cups of arch supports to help reduce stress on the plantar fascia. Occasionally, corticosteroid injections are given to reduce inflammation. If symptoms persist for greater than 6 months despite non-surgical (conservative), then surgery may be recommended.   MEDICATION   If pain medication is necessary, then nonsteroidal anti-inflammatory medications, such as aspirin and ibuprofen, or other minor pain relievers, such as acetaminophen, are often recommended.  Do not take pain medication within 7 days before surgery.  Prescription pain relievers may be given if deemed necessary by your caregiver. Use only as directed and only as much as you need.  Corticosteroid injections may be given by your caregiver. These injections should be reserved for the most serious cases, because they may only be given a certain number of times.  HEAT AND COLD  Cold treatment (icing) relieves pain and reduces inflammation. Cold treatment should be applied for 10 to 15 minutes every 2 to 3 hours for inflammation and pain and immediately after any activity that aggravates your symptoms. Use ice packs or massage the area with a piece of ice (ice massage).  Heat treatment may be used prior to performing the stretching and strengthening activities prescribed by your  caregiver, physical therapist, or athletic trainer. Use a heat pack or soak the injury in warm water.  SEEK IMMEDIATE MEDICAL CARE IF:  Treatment seems to offer no benefit,  or the condition worsens.  Any medications produce adverse side effects.  EXERCISES- RANGE OF MOTION (ROM) AND STRETCHING EXERCISES - Plantar Fasciitis (Heel Spur Syndrome) These exercises may help you when beginning to rehabilitate your injury. Your symptoms may resolve with or without further involvement from your physician, physical therapist or athletic trainer. While completing these exercises, remember:   Restoring tissue flexibility helps normal motion to return to the joints. This allows healthier, less painful movement and activity.  An effective stretch should be held for at least 30 seconds.  A stretch should never be painful. You should only feel a gentle lengthening or release in the stretched tissue.  RANGE OF MOTION - Toe Extension, Flexion  Sit with your right / left leg crossed over your opposite knee.  Grasp your toes and gently pull them back toward the top of your foot. You should feel a stretch on the bottom of your toes and/or foot.  Hold this stretch for 10 seconds.  Now, gently pull your toes toward the bottom of your foot. You should feel a stretch on the top of your toes and or foot.  Hold this stretch for 10 seconds. Repeat  times. Complete this stretch 3 times per day.   RANGE OF MOTION - Ankle Dorsiflexion, Active Assisted  Remove shoes and sit on a chair that is preferably not on a carpeted surface.  Place right / left foot under knee. Extend your opposite leg for support.  Keeping your heel down, slide your right / left foot back toward the chair until you feel a stretch at your ankle or calf. If you do not feel a stretch, slide your bottom forward to the edge of the chair, while still keeping your heel down.  Hold this stretch for 10 seconds. Repeat 3 times. Complete this stretch 2 times per day.   STRETCH  Gastroc, Standing  Place hands on wall.  Extend right / left leg, keeping the front knee somewhat bent.  Slightly point your toes inward  on your back foot.  Keeping your right / left heel on the floor and your knee straight, shift your weight toward the wall, not allowing your back to arch.  You should feel a gentle stretch in the right / left calf. Hold this position for 10 seconds. Repeat 3 times. Complete this stretch 2 times per day.  STRETCH  Soleus, Standing  Place hands on wall.  Extend right / left leg, keeping the other knee somewhat bent.  Slightly point your toes inward on your back foot.  Keep your right / left heel on the floor, bend your back knee, and slightly shift your weight over the back leg so that you feel a gentle stretch deep in your back calf.  Hold this position for 10 seconds. Repeat 3 times. Complete this stretch 2 times per day.  STRETCH  Gastrocsoleus, Standing  Note: This exercise can place a lot of stress on your foot and ankle. Please complete this exercise only if specifically instructed by your caregiver.   Place the ball of your right / left foot on a step, keeping your other foot firmly on the same step.  Hold on to the wall or a rail for balance.  Slowly lift your other foot, allowing your body weight to press your heel down  over the edge of the step.  You should feel a stretch in your right / left calf.  Hold this position for 10 seconds.  Repeat this exercise with a slight bend in your right / left knee. Repeat 3 times. Complete this stretch 2 times per day.   STRENGTHENING EXERCISES - Plantar Fasciitis (Heel Spur Syndrome)  These exercises may help you when beginning to rehabilitate your injury. They may resolve your symptoms with or without further involvement from your physician, physical therapist or athletic trainer. While completing these exercises, remember:   Muscles can gain both the endurance and the strength needed for everyday activities through controlled exercises.  Complete these exercises as instructed by your physician, physical therapist or athletic  trainer. Progress the resistance and repetitions only as guided.  STRENGTH - Towel Curls  Sit in a chair positioned on a non-carpeted surface.  Place your foot on a towel, keeping your heel on the floor.  Pull the towel toward your heel by only curling your toes. Keep your heel on the floor. Repeat 3 times. Complete this exercise 2 times per day.  STRENGTH - Ankle Inversion  Secure one end of a rubber exercise band/tubing to a fixed object (table, pole). Loop the other end around your foot just before your toes.  Place your fists between your knees. This will focus your strengthening at your ankle.  Slowly, pull your big toe up and in, making sure the band/tubing is positioned to resist the entire motion.  Hold this position for 10 seconds.  Have your muscles resist the band/tubing as it slowly pulls your foot back to the starting position. Repeat 3 times. Complete this exercises 2 times per day.  Document Released: 10/23/2005 Document Revised: 01/15/2012 Document Reviewed: 02/04/2009 [facility] Patient Information 2014 [place], [place].

## 2020-06-01 NOTE — Progress Notes (Signed)
°  Subjective:  Patient ID: Brittany Gomez, female    DOB: August 10, 1951,  MRN: [id number]  Chief Complaint  Patient presents with   Foot Pain    Left foot; arch; pt stated, "I have had no injuries; has a constant pain; feels like I am stepping on tacks; icing foot has helped"; x4 months   Medication Refill    Diclofenac 75 mg; pt stated, "I am out of this medicine and would like a refill"    69 y.o. female presents with the above complaint. History confirmed with patient.  Pain is present in the plantar and posterior heel, the plantar heel is the worst.  Her PCP has prescribed her oral diclofenac and this has been helpful.  She would like a refill.  Objective:  Physical Exam: warm, good capillary refill, no trophic changes or ulcerative lesions, normal DP and PT pulses and normal sensory exam. Left Foot: point tenderness over the heel pad and tenderness at Achilles tendon insertion, gastroc-soleus equinus is present   Radiographs: X-ray of the left foot: plantar calcaneal spur, posterior calcaneal spur and pes planus Assessment:   1. Left foot pain   2. Plantar fasciitis, left   3. Achilles tendinitis, left leg      Plan:  Patient was evaluated and treated and all questions answered.   -XR reviewed with patient -Educated patient on stretching and icing of the affected limb.  Rest for at least 2 weeks and then she may begin exercises.  If it is painful doing the exercises that she should wait another 2 weeks -Recommend topical Voltaren gel to be applied directly to the Achilles tendon.  I educated her that I prefer not to inject steroid at the tendon insertion as it could increased risk of rupture -Recommend immobilization in CAM Walker boot -Injection delivered to the plantar fascia of the left foot.  After sterile prep with povidone-iodine solution and alcohol, the left medial heel was injected with1cc 0.5% marcaine plain, 5mg  triamcinolone acetonide, and 2mg  dexamethasone  was injected along the plantar fascia at the insertion on the plantar calcaneus. The patient tolerated the procedure well without complication.  , DPM 06/01/2020     Return in about 2 weeks (around 06/15/2020) for recheck plantar fasciitis.

## 2020-06-04 DIAGNOSIS — E7800 Pure hypercholesterolemia, unspecified: Secondary | ICD-10-CM | POA: Diagnosis not present

## 2020-06-04 DIAGNOSIS — J301 Allergic rhinitis due to pollen: Secondary | ICD-10-CM | POA: Diagnosis not present

## 2020-06-04 DIAGNOSIS — F5101 Primary insomnia: Secondary | ICD-10-CM | POA: Diagnosis not present

## 2020-06-04 DIAGNOSIS — R030 Elevated blood-pressure reading, without diagnosis of hypertension: Secondary | ICD-10-CM | POA: Diagnosis not present

## 2020-06-04 DIAGNOSIS — F419 Anxiety disorder, unspecified: Secondary | ICD-10-CM | POA: Diagnosis not present

## 2020-06-04 DIAGNOSIS — M722 Plantar fascial fibromatosis: Secondary | ICD-10-CM | POA: Diagnosis not present

## 2020-06-17 ENCOUNTER — Other Ambulatory Visit: Payer: Self-pay

## 2020-06-17 ENCOUNTER — Ambulatory Visit: Payer: Medicare HMO | Admitting: Podiatry

## 2020-06-17 ENCOUNTER — Ambulatory Visit (INDEPENDENT_AMBULATORY_CARE_PROVIDER_SITE_OTHER): Payer: Medicare HMO | Admitting: Family Medicine

## 2020-06-17 ENCOUNTER — Encounter: Payer: Self-pay | Admitting: Family Medicine

## 2020-06-17 VITALS — BP 125/87 | HR 103 | Temp 97.6°F | Ht 68.0 in | Wt 173.6 lb

## 2020-06-17 DIAGNOSIS — M722 Plantar fascial fibromatosis: Secondary | ICD-10-CM

## 2020-06-17 DIAGNOSIS — M216X2 Other acquired deformities of left foot: Secondary | ICD-10-CM

## 2020-06-17 DIAGNOSIS — K047 Periapical abscess without sinus: Secondary | ICD-10-CM

## 2020-06-17 DIAGNOSIS — M79672 Pain in left foot: Secondary | ICD-10-CM | POA: Diagnosis not present

## 2020-06-17 DIAGNOSIS — K0889 Other specified disorders of teeth and supporting structures: Secondary | ICD-10-CM

## 2020-06-17 DIAGNOSIS — M7662 Achilles tendinitis, left leg: Secondary | ICD-10-CM

## 2020-06-17 MED ORDER — HYDROCODONE-ACETAMINOPHEN 5-325 MG PO TABS: ORAL_TABLET | ORAL | 0 refills | Status: DC

## 2020-06-17 MED ORDER — DICLOFENAC SODIUM 75 MG PO TBEC: 75.0000 mg | DELAYED_RELEASE_TABLET | Freq: Two times a day (BID) | ORAL | 0 refills | Status: DC

## 2020-06-17 MED ORDER — PENICILLIN V POTASSIUM 500 MG PO TABS: 500.0000 mg | ORAL_TABLET | Freq: Four times a day (QID) | ORAL | 0 refills | Status: DC

## 2020-06-17 NOTE — Progress Notes (Signed)
  Subjective:  Patient ID: Brittany Gomez, female    DOB: 27-Aug-1951,  MRN: [id number]  Chief Complaint  Patient presents with  . Follow-up    L plantar fasciitis and tendonitis. Pt stated, "It's improved. Pain = 7/10. I'm using Voltaren, and it helps. Wearing the boot".    69 y.o. female returns with the above complaint. History confirmed with patient.  Some improvement in the plantar fasciitis from the injection.  Heel pain is about the same.  She been doing the injections.  She is out of diclofenac and request more.  Objective:  Physical Exam: warm, good capillary refill, no trophic changes or ulcerative lesions, normal DP and PT pulses and normal sensory exam. Left Foot: point tenderness over the heel pad and tenderness at Achilles tendon insertion, gastroc-soleus equinus is present.  Inferior heel pain is less tender than previous    Assessment:   1. Left foot pain   2. Plantar fasciitis, left   3. Achilles tendinitis, left leg   4. Equinus deformity of left foot      Plan:  Patient was evaluated and treated and all questions answered.    -Referral placed to physical therapy at [facility] -Continue Voltaren gel -Continue WBAT and immobilization in CAM Walker boot -I refilled diclofenac prescription for her, she may take as needed -If she is not improving by next visit, we will consider MRI  Sharl Ma, DPM 06/17/2020     Return in about 6 weeks (around 07/29/2020) for re-check Achilles tendon.

## 2020-06-17 NOTE — Progress Notes (Signed)
Patient ID: Brittany Gomez, female    DOB: 10-23-1951  Age: 69 y.o. MRN: [id number]  Chief Complaint  Patient presents with  . Tooth abscess    Pt stated that she has a tooth abscess on the Rt side of her mouth that has been going since 06/13/2020    Subjective:   69 year old lady who comes in with her dental abscess.  Does not have a dentist and cannot afford to see one.    Current allergies, medications, problem list, past/family and social histories reviewed.  Objective:  BP 125/87 (BP Location: Right Arm, Patient Position: Sitting, Cuff Size: Normal)   Pulse (!) 103   Temp 97.6 F (36.4 C) (Temporal)   Ht 5\' 8"  (1.727 m)   Wt 173 lb 9.6 oz (78.7 kg)   SpO2 95%   BMI 26.40 kg/m   Very painful right lower gum margin of teeth with erythema and small amount of induration.  Not an amount that I feel like I can easily I&D myself.  She needs to see a dentist.  Has tenderness under the jaw but no major enlargement of the nodes at this time.  Very stressed and anxious and down about this.  Assessment & Plan:   Assessment: 1. Dental abscess   2. Pain, dental       Plan: See instructions  No orders of the defined types were placed in this encounter.   Meds ordered this encounter  Medications  . penicillin v potassium (VEETID) 500 MG tablet    Sig: Take 1 tablet (500 mg total) by mouth 4 (four) times daily.    Dispense:  40 tablet    Refill:  0  . HYDROcodone-acetaminophen (NORCO) 5-325 MG tablet    Sig: Take 1 every 4-6 hours as needed for severe pain.    Dispense:  30 tablet    Refill:  0         Patient Instructions   Penicillin 500 mg 4 times daily by mouth  Norco 5 1 every 6 hours as needed for pain.    In addition to this can take over-the-counter ibuprofen 200 mg 3 pills 3 times daily  Is important you try to see a dentist because the problem will probably recur.  Consider the senior resource center as a possible place to get some assistance for  direction on where to go.    Contact  [facility] is located in [place], [place].  Our mailing address is: mail [address] [address] [place], [place] email [email or web address] Our offices are located in [place]:    [facility] [address] [place], [place]  Telephone: [phone] Fax: [phone] Email: [email or web address]     If you have lab work done today you will be contacted with your lab results within the next 2 weeks.  If you have not heard from [phone] then please contact us. The fastest way to get your results is to register for My Chart.   IF you received an x-ray today, you will receive an invoice from [facility]. Please contact [facility] at [phone] with questions or concerns regarding your invoice.   IF you received labwork today, you will receive an invoice from [facility]. Please contact [facility] at [phone] with questions or concerns regarding your invoice.   Our billing staff will not be able to assist you with questions regarding bills from these companies.  You will be contacted with the  lab results as soon as they are available. The fastest way to get your results is to activate your My Chart account. Instructions are located on the last page of this paperwork. If you have not heard from [place] regarding the results in 2 weeks, please contact this office.        Return if symptoms worsen or fail to improve.   Janace Hoard, MD 06/17/2020

## 2020-06-17 NOTE — Patient Instructions (Addendum)
Penicillin 500 mg 4 times daily by mouth  Norco 5 1 every 6 hours as needed for pain.    In addition to this can take over-the-counter ibuprofen 200 mg 3 pills 3 times daily  Is important you try to see a dentist because the problem will probably recur.  Consider the senior resource center as a possible place to get some assistance for direction on where to go.    Contact us  [facility] is located in [place], [place].  Our mailing address is: mail [address] [address] [place], [place] [address] email [email or web address] Our offices are located in [place] & [place]:    [facility] [address] [place], [place] [address]  Telephone: [phone] Fax: [phone] Email: [email or web address]     If you have lab work done today you will be contacted with your lab results within the next 2 weeks.  If you have not heard from [place] then please contact us. The fastest way to get your results is to register for My Chart.   IF you received an x-ray today, you will receive an invoice from [facility]. Please contact [facility] at [phone] with questions or concerns regarding your invoice.   IF you received labwork today, you will receive an invoice from [facility]. Please contact [facility] at [phone] with questions or concerns regarding your invoice.   Our billing staff will not be able to assist you with questions regarding bills from these companies.  You will be contacted with the lab results as soon as they are available. The fastest way to get your results is to activate your My Chart account. Instructions are located on the last page of this paperwork. If you have not heard from [place] regarding the results in 2 weeks, please contact this office.

## 2020-07-07 DIAGNOSIS — M722 Plantar fascial fibromatosis: Secondary | ICD-10-CM | POA: Diagnosis not present

## 2020-07-07 DIAGNOSIS — M7662 Achilles tendinitis, left leg: Secondary | ICD-10-CM | POA: Diagnosis not present

## 2020-07-07 DIAGNOSIS — M21612 Bunion of left foot: Secondary | ICD-10-CM | POA: Diagnosis not present

## 2020-07-07 DIAGNOSIS — M79672 Pain in left foot: Secondary | ICD-10-CM | POA: Diagnosis not present

## 2020-07-07 DIAGNOSIS — M25672 Stiffness of left ankle, not elsewhere classified: Secondary | ICD-10-CM | POA: Diagnosis not present

## 2020-07-07 DIAGNOSIS — M25675 Stiffness of left foot, not elsewhere classified: Secondary | ICD-10-CM | POA: Diagnosis not present

## 2020-07-09 DIAGNOSIS — M21612 Bunion of left foot: Secondary | ICD-10-CM | POA: Diagnosis not present

## 2020-07-09 DIAGNOSIS — M25672 Stiffness of left ankle, not elsewhere classified: Secondary | ICD-10-CM | POA: Diagnosis not present

## 2020-07-09 DIAGNOSIS — M79672 Pain in left foot: Secondary | ICD-10-CM | POA: Diagnosis not present

## 2020-07-09 DIAGNOSIS — M25675 Stiffness of left foot, not elsewhere classified: Secondary | ICD-10-CM | POA: Diagnosis not present

## 2020-07-09 DIAGNOSIS — M7662 Achilles tendinitis, left leg: Secondary | ICD-10-CM | POA: Diagnosis not present

## 2020-07-09 DIAGNOSIS — M722 Plantar fascial fibromatosis: Secondary | ICD-10-CM | POA: Diagnosis not present

## 2020-07-13 DIAGNOSIS — M25675 Stiffness of left foot, not elsewhere classified: Secondary | ICD-10-CM | POA: Diagnosis not present

## 2020-07-13 DIAGNOSIS — M79672 Pain in left foot: Secondary | ICD-10-CM | POA: Diagnosis not present

## 2020-07-13 DIAGNOSIS — M25672 Stiffness of left ankle, not elsewhere classified: Secondary | ICD-10-CM | POA: Diagnosis not present

## 2020-07-13 DIAGNOSIS — M7662 Achilles tendinitis, left leg: Secondary | ICD-10-CM | POA: Diagnosis not present

## 2020-07-13 DIAGNOSIS — M722 Plantar fascial fibromatosis: Secondary | ICD-10-CM | POA: Diagnosis not present

## 2020-07-13 DIAGNOSIS — M21612 Bunion of left foot: Secondary | ICD-10-CM | POA: Diagnosis not present

## 2020-07-15 ENCOUNTER — Ambulatory Visit: Payer: Medicare HMO | Admitting: Podiatry

## 2020-07-19 ENCOUNTER — Telehealth: Payer: Self-pay | Admitting: *Deleted

## 2020-07-19 NOTE — Telephone Encounter (Signed)
Pt called back, I tried to get her AWV scheduled. She does not want to at this time.

## 2020-07-19 NOTE — Telephone Encounter (Signed)
Schedule AWV after 9-21

## 2020-07-21 DIAGNOSIS — Z23 Encounter for immunization: Secondary | ICD-10-CM | POA: Diagnosis not present

## 2020-07-23 DIAGNOSIS — M21612 Bunion of left foot: Secondary | ICD-10-CM | POA: Diagnosis not present

## 2020-07-23 DIAGNOSIS — M722 Plantar fascial fibromatosis: Secondary | ICD-10-CM | POA: Diagnosis not present

## 2020-07-23 DIAGNOSIS — M25672 Stiffness of left ankle, not elsewhere classified: Secondary | ICD-10-CM | POA: Diagnosis not present

## 2020-07-23 DIAGNOSIS — M79672 Pain in left foot: Secondary | ICD-10-CM | POA: Diagnosis not present

## 2020-07-23 DIAGNOSIS — M25675 Stiffness of left foot, not elsewhere classified: Secondary | ICD-10-CM | POA: Diagnosis not present

## 2020-07-23 DIAGNOSIS — M7662 Achilles tendinitis, left leg: Secondary | ICD-10-CM | POA: Diagnosis not present

## 2020-07-27 ENCOUNTER — Ambulatory Visit: Payer: Medicare HMO | Admitting: Podiatry

## 2020-07-28 ENCOUNTER — Telehealth: Payer: Self-pay | Admitting: *Deleted

## 2020-07-28 NOTE — Telephone Encounter (Signed)
Schedule mobile mammogram 9-27 @ [facility]

## 2020-07-30 ENCOUNTER — Ambulatory Visit (INDEPENDENT_AMBULATORY_CARE_PROVIDER_SITE_OTHER): Payer: Medicare HMO | Admitting: Registered Nurse

## 2020-07-30 ENCOUNTER — Other Ambulatory Visit: Payer: Self-pay

## 2020-07-30 ENCOUNTER — Encounter: Payer: Self-pay | Admitting: Registered Nurse

## 2020-07-30 VITALS — BP 135/82 | HR 121 | Ht 66.0 in | Wt 162.4 lb

## 2020-07-30 DIAGNOSIS — F419 Anxiety disorder, unspecified: Secondary | ICD-10-CM

## 2020-07-30 DIAGNOSIS — B354 Tinea corporis: Secondary | ICD-10-CM

## 2020-07-30 MED ORDER — CLOTRIMAZOLE-BETAMETHASONE 1-0.05 % EX CREA: 1.0000 "application " | TOPICAL_CREAM | Freq: Two times a day (BID) | CUTANEOUS | 2 refills | Status: DC

## 2020-07-30 MED ORDER — HYDROXYZINE HCL 50 MG PO TABS: 50.0000 mg | ORAL_TABLET | Freq: Three times a day (TID) | ORAL | 0 refills | Status: DC | PRN

## 2020-07-30 MED ORDER — ALPRAZOLAM 0.25 MG PO TBDP: 0.2500 mg | ORAL_TABLET | Freq: Two times a day (BID) | ORAL | 0 refills | Status: DC | PRN

## 2020-07-30 NOTE — Patient Instructions (Signed)
° ° ° °  If you have lab work done today you will be contacted with your lab results within the next 2 weeks.  If you have not heard from us then please contact us. The fastest way to get your results is to register for My Chart. ° ° °IF you received an x-ray today, you will receive an invoice from [facility]. Please contact [facility] at [phone] with questions or concerns regarding your invoice.  ° °IF you received labwork today, you will receive an invoice from [facility]. Please contact [facility] at [phone] with questions or concerns regarding your invoice.  ° °Our billing staff will not be able to assist you with questions regarding bills from these companies. ° °You will be contacted with the lab results as soon as they are available. The fastest way to get your results is to activate your My Chart account. Instructions are located on the last page of this paperwork. If you have not heard from us regarding the results in 2 weeks, please contact this office. °  ° ° ° °

## 2020-08-03 ENCOUNTER — Telehealth: Payer: Self-pay | Admitting: Family Medicine

## 2020-08-03 NOTE — Telephone Encounter (Signed)
Pt saw richard on 07-30-2020 and was given hydroxyzine 50 mg. Pt states she can not take that medication. Pt  Heart was  racing and she broke out in a sweat. Pt read insert and she said its said this is medication given to patient prior to surgery also insert mention heart palps and dizziness. Please advice. Pt has stop the medication today. Pt would like something else. Pt is no longer having  dizziness nor having heart racing . [facility] on [address]

## 2020-08-03 NOTE — Telephone Encounter (Signed)
Please Advise

## 2020-08-13 DIAGNOSIS — F419 Anxiety disorder, unspecified: Secondary | ICD-10-CM | POA: Diagnosis not present

## 2020-08-19 ENCOUNTER — Other Ambulatory Visit: Payer: Self-pay | Admitting: Registered Nurse

## 2020-08-19 DIAGNOSIS — F419 Anxiety disorder, unspecified: Secondary | ICD-10-CM

## 2020-08-19 MED ORDER — BUSPIRONE HCL 5 MG PO TABS: 5.0000 mg | ORAL_TABLET | Freq: Three times a day (TID) | ORAL | 3 refills | Status: DC

## 2020-08-19 NOTE — Telephone Encounter (Signed)
Sent buspar 5mg  PO tid Can try this instead.  Thanks  , NP

## 2020-08-24 DIAGNOSIS — M25672 Stiffness of left ankle, not elsewhere classified: Secondary | ICD-10-CM | POA: Diagnosis not present

## 2020-08-24 DIAGNOSIS — M25675 Stiffness of left foot, not elsewhere classified: Secondary | ICD-10-CM | POA: Diagnosis not present

## 2020-08-24 DIAGNOSIS — M21612 Bunion of left foot: Secondary | ICD-10-CM | POA: Diagnosis not present

## 2020-08-24 DIAGNOSIS — M7662 Achilles tendinitis, left leg: Secondary | ICD-10-CM | POA: Diagnosis not present

## 2020-08-24 DIAGNOSIS — M79672 Pain in left foot: Secondary | ICD-10-CM | POA: Diagnosis not present

## 2020-08-24 DIAGNOSIS — M722 Plantar fascial fibromatosis: Secondary | ICD-10-CM | POA: Diagnosis not present

## 2020-08-26 ENCOUNTER — Ambulatory Visit: Payer: Medicare HMO | Admitting: Podiatry

## 2020-08-26 DIAGNOSIS — M722 Plantar fascial fibromatosis: Secondary | ICD-10-CM | POA: Diagnosis not present

## 2020-08-26 DIAGNOSIS — M79672 Pain in left foot: Secondary | ICD-10-CM | POA: Diagnosis not present

## 2020-08-26 DIAGNOSIS — M25675 Stiffness of left foot, not elsewhere classified: Secondary | ICD-10-CM | POA: Diagnosis not present

## 2020-08-26 DIAGNOSIS — M25672 Stiffness of left ankle, not elsewhere classified: Secondary | ICD-10-CM | POA: Diagnosis not present

## 2020-08-26 DIAGNOSIS — M7662 Achilles tendinitis, left leg: Secondary | ICD-10-CM | POA: Diagnosis not present

## 2020-08-26 DIAGNOSIS — M21612 Bunion of left foot: Secondary | ICD-10-CM | POA: Diagnosis not present

## 2020-08-27 ENCOUNTER — Ambulatory Visit: Payer: Medicare HMO | Admitting: Podiatry

## 2020-08-27 NOTE — Telephone Encounter (Signed)
LVM to see if pt picked up the buspar 5mg 

## 2020-08-31 DIAGNOSIS — M7662 Achilles tendinitis, left leg: Secondary | ICD-10-CM | POA: Diagnosis not present

## 2020-08-31 DIAGNOSIS — M722 Plantar fascial fibromatosis: Secondary | ICD-10-CM | POA: Diagnosis not present

## 2020-08-31 DIAGNOSIS — M25675 Stiffness of left foot, not elsewhere classified: Secondary | ICD-10-CM | POA: Diagnosis not present

## 2020-08-31 DIAGNOSIS — M21612 Bunion of left foot: Secondary | ICD-10-CM | POA: Diagnosis not present

## 2020-08-31 DIAGNOSIS — M25672 Stiffness of left ankle, not elsewhere classified: Secondary | ICD-10-CM | POA: Diagnosis not present

## 2020-08-31 DIAGNOSIS — M79672 Pain in left foot: Secondary | ICD-10-CM | POA: Diagnosis not present

## 2020-09-02 DIAGNOSIS — M25675 Stiffness of left foot, not elsewhere classified: Secondary | ICD-10-CM | POA: Diagnosis not present

## 2020-09-02 DIAGNOSIS — M25672 Stiffness of left ankle, not elsewhere classified: Secondary | ICD-10-CM | POA: Diagnosis not present

## 2020-09-02 DIAGNOSIS — M7662 Achilles tendinitis, left leg: Secondary | ICD-10-CM | POA: Diagnosis not present

## 2020-09-02 DIAGNOSIS — M79672 Pain in left foot: Secondary | ICD-10-CM | POA: Diagnosis not present

## 2020-09-02 DIAGNOSIS — M21612 Bunion of left foot: Secondary | ICD-10-CM | POA: Diagnosis not present

## 2020-09-02 DIAGNOSIS — M722 Plantar fascial fibromatosis: Secondary | ICD-10-CM | POA: Diagnosis not present

## 2020-09-07 DIAGNOSIS — M722 Plantar fascial fibromatosis: Secondary | ICD-10-CM | POA: Diagnosis not present

## 2020-09-07 DIAGNOSIS — M7662 Achilles tendinitis, left leg: Secondary | ICD-10-CM | POA: Diagnosis not present

## 2020-09-07 DIAGNOSIS — M25672 Stiffness of left ankle, not elsewhere classified: Secondary | ICD-10-CM | POA: Diagnosis not present

## 2020-09-07 DIAGNOSIS — M79672 Pain in left foot: Secondary | ICD-10-CM | POA: Diagnosis not present

## 2020-09-07 DIAGNOSIS — M21612 Bunion of left foot: Secondary | ICD-10-CM | POA: Diagnosis not present

## 2020-09-07 DIAGNOSIS — M25675 Stiffness of left foot, not elsewhere classified: Secondary | ICD-10-CM | POA: Diagnosis not present

## 2020-09-10 ENCOUNTER — Ambulatory Visit: Payer: Medicare HMO | Admitting: Podiatry

## 2020-09-13 ENCOUNTER — Encounter: Payer: Self-pay | Admitting: Podiatry

## 2020-09-13 ENCOUNTER — Other Ambulatory Visit: Payer: Self-pay

## 2020-09-13 ENCOUNTER — Ambulatory Visit: Payer: Medicare HMO | Admitting: Podiatry

## 2020-09-13 DIAGNOSIS — M216X2 Other acquired deformities of left foot: Secondary | ICD-10-CM | POA: Diagnosis not present

## 2020-09-13 DIAGNOSIS — M722 Plantar fascial fibromatosis: Secondary | ICD-10-CM | POA: Diagnosis not present

## 2020-09-13 DIAGNOSIS — M7662 Achilles tendinitis, left leg: Secondary | ICD-10-CM

## 2020-09-13 DIAGNOSIS — M21862 Other specified acquired deformities of left lower leg: Secondary | ICD-10-CM

## 2020-09-13 NOTE — Progress Notes (Signed)
  Subjective:  Patient ID: Brittany Gomez, female    DOB: [birth year],  MRN: [id number]  Chief Complaint  Patient presents with  . Plantar Fasciitis    "its doing so much better.  Physical therapy has really helped me"    69 y.o. female returns with the above complaint. History confirmed with patient.  She is doing much better and is nearly pain-free now.  Physical therapy has been very beneficial for her Objective:  Physical Exam: warm, good capillary refill, no trophic changes or ulcerative lesions, normal DP and PT pulses and normal sensory exam. Left Foot: Very mild tenderness at Achilles insertion and plantar heel but overall much better.  Equinus improved.    Assessment:   No diagnosis found.   Plan:  Patient was evaluated and treated and all questions answered.    -Continue physical therapy for as long it is beneficial  Resume full activity as tolerated  Return if worsening or does not improve  Sharl Ma, DPM 09/13/2020     Return if symptoms worsen or fail to improve.

## 2020-09-14 DIAGNOSIS — M79672 Pain in left foot: Secondary | ICD-10-CM | POA: Diagnosis not present

## 2020-09-14 DIAGNOSIS — M7662 Achilles tendinitis, left leg: Secondary | ICD-10-CM | POA: Diagnosis not present

## 2020-09-14 DIAGNOSIS — M25675 Stiffness of left foot, not elsewhere classified: Secondary | ICD-10-CM | POA: Diagnosis not present

## 2020-09-14 DIAGNOSIS — M722 Plantar fascial fibromatosis: Secondary | ICD-10-CM | POA: Diagnosis not present

## 2020-09-14 DIAGNOSIS — M25672 Stiffness of left ankle, not elsewhere classified: Secondary | ICD-10-CM | POA: Diagnosis not present

## 2020-09-14 DIAGNOSIS — M21612 Bunion of left foot: Secondary | ICD-10-CM | POA: Diagnosis not present

## 2020-10-03 ENCOUNTER — Encounter: Payer: Self-pay | Admitting: Registered Nurse

## 2020-10-03 NOTE — Progress Notes (Signed)
Acute Office Visit  Subjective:    Patient ID: Brittany Gomez, female    DOB: 1950-11-19, 69 y.o.   MRN: [id number]  Chief Complaint  Patient presents with  . Rash    Pt stated that she has been experiencing a rash on her abd for the past week. It is itchy and stays moist and she pust powder on it to help with the moisture. Also experiencing some anxiety 17/Dep 1    HPI Patient is in today for rash and anxiety  Rash: ongoing for around a week. On trunk. Red and mottled, itchy, sometimes weepy and moist. Has been using OTC powder to keep dry but this has not made much improvement. Does not seem to spread much  Anxiety: unfortunately due to some life changes has been prone to panic and trouble sleeping lately. Not interested in long term medication but would like something to get through the next few weeks.  No other concerns at this time.  Past Medical History:  Diagnosis Date  . Allergy   . Anxiety   . Arthritis     Past Surgical History:  Procedure Laterality Date  . CESAREAN SECTION      Family History  Problem Relation Age of Onset  . Diabetes Mother   . Stroke Father   . Cancer Sister   . Hypertension Sister   . Breast cancer Neg Hx     Social History   Socioeconomic History  . Marital status: Married    Spouse name: Not on file  . Number of children: Not on file  . Years of education: Not on file  . Highest education level: Not on file  Occupational History  . Not on file  Tobacco Use  . Smoking status: Former Smoker    Types: Cigarettes  . Smokeless tobacco: Never Used  Substance and Sexual Activity  . Alcohol use: No  . Drug use: No  . Sexual activity: Not Currently  Other Topics Concern  . Not on file  Social History Narrative  . Not on file   Social Determinants of Health   Financial Resource Strain:   . Difficulty of Paying Living Expenses: Not on file  Food Insecurity:   . Worried About Programme researcher, broadcasting/film/video in the Last Year: Not  on file  . Ran Out of Food in the Last Year: Not on file  Transportation Needs:   . Lack of Transportation (Medical): Not on file  . Lack of Transportation (Non-Medical): Not on file  Physical Activity:   . Days of Exercise per Week: Not on file  . Minutes of Exercise per Session: Not on file  Stress:   . Feeling of Stress : Not on file  Social Connections:   . Frequency of Communication with Friends and Family: Not on file  . Frequency of Social Gatherings with Friends and Family: Not on file  . Attends Religious Services: Not on file  . Active Member of Clubs or Organizations: Not on file  . Attends Banker Meetings: Not on file  . Marital Status: Not on file  Intimate Partner Violence:   . Fear of Current or Ex-Partner: Not on file  . Emotionally Abused: Not on file  . Physically Abused: Not on file  . Sexually Abused: Not on file    Outpatient Medications Prior to Visit  Medication Sig Dispense Refill  . albuterol (VENTOLIN HFA) 108 (90 Base) MCG/ACT inhaler Inhale 2 puffs into the lungs every 6 (  six) hours as needed for wheezing or shortness of breath. 8 g 2  . diclofenac (VOLTAREN) 75 MG EC tablet Take 1 tablet (75 mg total) by mouth 2 (two) times daily. 60 tablet 0  . escitalopram (LEXAPRO) 20 MG tablet TAKE 1 TABLET BY MOUTH DAILY 90 tablet 1  . gabapentin (NEURONTIN) 300 MG capsule Take 1 capsule (300 mg total) by mouth 3 (three) times daily. 90 capsule 3  . HYDROcodone-acetaminophen (NORCO) 5-325 MG tablet Take 1 every 4-6 hours as needed for severe pain. 30 tablet 0  . levocetirizine (XYZAL) 5 MG tablet Take 5 mg by mouth daily.    . mirtazapine (REMERON) 15 MG tablet TAKE 1 TABLET BY MOUTH AT BEDTIME 30 tablet 2  . penicillin v potassium (VEETID) 500 MG tablet Take 1 tablet (500 mg total) by mouth 4 (four) times daily. 40 tablet 0  . SUMAtriptan (IMITREX) 25 MG tablet Take 1 tablet (25 mg total) by mouth daily as needed for migraine. May repeat in 2 hours if  headache persists or recurs. 10 tablet 3  . topiramate (TOPAMAX) 50 MG tablet Take 1 tablet (50 mg total) by mouth at bedtime. 30 tablet 3   No facility-administered medications prior to visit.    No Known Allergies  Review of Systems  Constitutional: Negative.   HENT: Negative.   Eyes: Negative.   Respiratory: Negative.   Cardiovascular: Negative.   Gastrointestinal: Negative.   Genitourinary: Negative.   Musculoskeletal: Negative.   Skin: Positive for rash.  Neurological: Negative.   Psychiatric/Behavioral: The patient is nervous/anxious.        Objective:    Physical Exam Vitals and nursing note reviewed.  Constitutional:      Appearance: Normal appearance.  Cardiovascular:     Rate and Rhythm: Normal rate and regular rhythm.     Heart sounds: Normal heart sounds.  Pulmonary:     Effort: Pulmonary effort is normal. No respiratory distress.     Breath sounds: Normal breath sounds.  Skin:    General: Skin is warm and dry.     Capillary Refill: Capillary refill takes less than 2 seconds.     Findings: Rash (tinea corporis) present.  Neurological:     General: No focal deficit present.     Mental Status: She is alert and oriented to person, place, and time. Mental status is at baseline.  Psychiatric:        Mood and Affect: Mood normal.        Behavior: Behavior normal.        Thought Content: Thought content normal.        Judgment: Judgment normal.     BP 135/82 (BP Location: Right Arm, Patient Position: Sitting, Cuff Size: Normal)   Pulse (!) 121   Ht 5\' 6"  (1.676 m)   Wt 162 lb 6.4 oz (73.7 kg)   SpO2 94%   BMI 26.21 kg/m  Wt Readings from Last 3 Encounters:  07/30/20 162 lb 6.4 oz (73.7 kg)  06/17/20 173 lb 9.6 oz (78.7 kg)  05/07/20 173 lb 9.6 oz (78.7 kg)    Health Maintenance Due  Topic Date Due  . Hepatitis C Screening  Never done  . TETANUS/TDAP  Never done  . DEXA SCAN  Never done  . MAMMOGRAM  07/01/2020    There are no preventive care  reminders to display for this patient.    Lab Results  Component Value Date   WBC 10.9 (H) 08/04/2019   HGB  13.8 08/04/2019   HCT 41.7 08/04/2019   MCV 93 08/04/2019   PLT 341 08/04/2019   Lab Results  Component Value Date   NA 137 07/15/2011   K 4.0 07/15/2011   CO2 29 07/15/2011   GLUCOSE 90 07/15/2011   BUN 8 07/15/2011   CREATININE 0.77 07/15/2011   BILITOT 0.4 07/15/2011   ALKPHOS 71 07/15/2011   AST 13 07/15/2011   ALT 7 07/15/2011   PROT 6.7 07/15/2011   ALBUMIN 3.6 07/15/2011   CALCIUM 9.4 07/15/2011   Lab Results  Component Value Date   CHOL (H) 04/12/2007    216        ATP III CLASSIFICATION:  <200     mg/dL   Desirable  342-876  mg/dL   Borderline High  >=811    mg/dL   High   Lab Results  Component Value Date   HDL 45 04/12/2007   Lab Results  Component Value Date   LDLCALC (H) 04/12/2007    154        Total Cholesterol/HDL:CHD Risk Coronary Heart Disease Risk Table                     Men   Women  1/2 Average Risk   3.4   3.3   Lab Results  Component Value Date   TRIG 84 04/12/2007   Lab Results  Component Value Date   CHOLHDL 4.8 04/12/2007   No results found for: HGBA1C     Assessment & Plan:   Problem List Items Addressed This Visit    None    Visit Diagnoses    Tinea corporis    -  Primary   Relevant Medications   clotrimazole-betamethasone (LOTRISONE) cream   Acute anxiety       Relevant Medications   ALPRAZolam (NIRAVAM) 0.25 MG dissolvable tablet   hydrOXYzine (ATARAX/VISTARIL) 50 MG tablet       Meds ordered this encounter  Medications  . clotrimazole-betamethasone (LOTRISONE) cream    Sig: Apply 1 application topically 2 (two) times daily.    Dispense:  30 g    Refill:  2    Order Specific Question:   Supervising Provider    Answer:   Neva Seat, JEFFREY R [[id number]]  . ALPRAZolam (NIRAVAM) 0.25 MG dissolvable tablet    Sig: Take 1 tablet (0.25 mg total) by mouth 2 (two) times daily as needed for anxiety.     Dispense:  60 tablet    Refill:  0    Order Specific Question:   Supervising Provider    Answer:   Neva Seat, JEFFREY R [[id number]]  . hydrOXYzine (ATARAX/VISTARIL) 50 MG tablet    Sig: Take 1 tablet (50 mg total) by mouth 3 (three) times daily as needed.    Dispense:  30 tablet    Refill:  0    Order Specific Question:   Supervising Provider    Answer:   Neva Seat, JEFFREY R [[id number]]   PLAN  lotrisone for rash. Twice daily until completely resolved  Alprazolam before bed / during day for panic attacks  Hydroxyzine for more mild anxiety  Return prn  Patient encouraged to call clinic with any questions, comments, or concerns.  I spent 43 minutes with this patient, more than 50% of which was spent counseling and/or educating.  Janeece Agee, NP

## 2020-10-11 DIAGNOSIS — M722 Plantar fascial fibromatosis: Secondary | ICD-10-CM | POA: Diagnosis not present

## 2020-10-11 DIAGNOSIS — M7662 Achilles tendinitis, left leg: Secondary | ICD-10-CM | POA: Diagnosis not present

## 2020-10-11 DIAGNOSIS — M25675 Stiffness of left foot, not elsewhere classified: Secondary | ICD-10-CM | POA: Diagnosis not present

## 2020-10-11 DIAGNOSIS — M21612 Bunion of left foot: Secondary | ICD-10-CM | POA: Diagnosis not present

## 2020-10-11 DIAGNOSIS — M25672 Stiffness of left ankle, not elsewhere classified: Secondary | ICD-10-CM | POA: Diagnosis not present

## 2020-10-11 DIAGNOSIS — M79672 Pain in left foot: Secondary | ICD-10-CM | POA: Diagnosis not present

## 2020-10-21 ENCOUNTER — Ambulatory Visit (INDEPENDENT_AMBULATORY_CARE_PROVIDER_SITE_OTHER): Payer: Medicare HMO | Admitting: Family Medicine

## 2020-10-21 ENCOUNTER — Other Ambulatory Visit: Payer: Self-pay

## 2020-10-21 ENCOUNTER — Ambulatory Visit (INDEPENDENT_AMBULATORY_CARE_PROVIDER_SITE_OTHER): Payer: Medicare HMO

## 2020-10-21 ENCOUNTER — Encounter: Payer: Self-pay | Admitting: Family Medicine

## 2020-10-21 VITALS — BP 131/87 | HR 96 | Temp 97.8°F | Ht 66.0 in | Wt 155.4 lb

## 2020-10-21 DIAGNOSIS — M722 Plantar fascial fibromatosis: Secondary | ICD-10-CM | POA: Diagnosis not present

## 2020-10-21 DIAGNOSIS — N812 Incomplete uterovaginal prolapse: Secondary | ICD-10-CM | POA: Diagnosis not present

## 2020-10-21 DIAGNOSIS — M25552 Pain in left hip: Secondary | ICD-10-CM

## 2020-10-21 DIAGNOSIS — T7411XA Adult physical abuse, confirmed, initial encounter: Secondary | ICD-10-CM

## 2020-10-21 DIAGNOSIS — F32A Depression, unspecified: Secondary | ICD-10-CM | POA: Diagnosis not present

## 2020-10-21 DIAGNOSIS — F419 Anxiety disorder, unspecified: Secondary | ICD-10-CM | POA: Diagnosis not present

## 2020-10-21 DIAGNOSIS — F5104 Psychophysiologic insomnia: Secondary | ICD-10-CM | POA: Diagnosis not present

## 2020-10-21 DIAGNOSIS — M7062 Trochanteric bursitis, left hip: Secondary | ICD-10-CM | POA: Diagnosis not present

## 2020-10-21 MED ORDER — PREDNISONE 20 MG PO TABS: ORAL_TABLET | ORAL | 0 refills | Status: DC

## 2020-10-21 MED ORDER — ZOLPIDEM TARTRATE 5 MG PO TABS: 5.0000 mg | ORAL_TABLET | Freq: Every evening | ORAL | 1 refills | Status: DC | PRN

## 2020-10-21 MED ORDER — DICLOFENAC SODIUM 75 MG PO TBEC: 75.0000 mg | DELAYED_RELEASE_TABLET | Freq: Two times a day (BID) | ORAL | 1 refills | Status: DC

## 2020-10-21 NOTE — Patient Instructions (Addendum)
[facility] [phone] [email or web address] .[place] Executive Director Location [address] [place], [place] [address] Located at the corner of [address] very near the [facility].  It is important that you get some professional counseling and advice, probably including legal advice.  This please should be able to help direct you.  If you are problems come back in here.  Referral is being made to a gynecologist for what appears to have been an intermittent incomplete uterine prolapse.  Take prednisone 20 mg 3 pills daily for 2 days, then 2 daily for 2 days, then 1 daily for 2 days for inflammation.  Take in the morning after breakfast so it does not disturb your sleep.  Take diclofenac 1 twice daily for pain and inflammation of trochanteric bursitis  Take Ambien 5 mg 1 at bedtime if needed for sleep.  Take caution because occasionally someone will get confusion from it and do things like sleep walk, and if that should occur stop taking it.  Return in 2 or 3 weeks if needed   If you have lab work done today you will be contacted with your lab results within the next 2 weeks.  If you have not heard from [place] then please contact us. The fastest way to get your results is to register for My Chart.   IF you received an x-ray today, you will receive an invoice from [facility]. Please contact [facility] at [phone] with questions or concerns regarding your invoice.   IF you received labwork today, you will receive an invoice from [facility]. Please contact [facility] at [phone] with questions or concerns regarding your invoice.   Our billing staff will not be able to assist you with questions regarding bills from these companies.  You will be contacted with the lab results as soon as they are available. The fastest way to get your results is to activate your My Chart account.  Instructions are located on the last page of this paperwork. If you have not heard from [place] regarding the results in 2 weeks, please contact this office.
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
## 2020-10-23 ENCOUNTER — Ambulatory Visit: Payer: Medicare HMO

## 2020-11-01 ENCOUNTER — Other Ambulatory Visit: Payer: Self-pay

## 2020-11-01 ENCOUNTER — Ambulatory Visit: Payer: Medicare HMO | Admitting: Obstetrics and Gynecology

## 2020-11-01 ENCOUNTER — Encounter: Payer: Self-pay | Admitting: Obstetrics and Gynecology

## 2020-11-01 VITALS — BP 110/76 | HR 84 | Ht 64.5 in | Wt 158.0 lb

## 2020-11-01 DIAGNOSIS — N8110 Cystocele, unspecified: Secondary | ICD-10-CM

## 2020-11-01 DIAGNOSIS — N393 Stress incontinence (female) (male): Secondary | ICD-10-CM | POA: Diagnosis not present

## 2020-11-01 DIAGNOSIS — N816 Rectocele: Secondary | ICD-10-CM | POA: Diagnosis not present

## 2020-11-01 NOTE — Patient Instructions (Addendum)
Consider a stool softener like Colace or Miralax powder.  These can be used daily.   Constipation, Adult Constipation is when a person has fewer bowel movements in a week than normal, has difficulty having a bowel movement, or has stools that are dry, hard, or larger than normal. Constipation may be caused by an underlying condition. It may become worse with age if a person takes certain medicines and does not take in enough fluids. Follow these instructions at home: Eating and drinking   Eat foods that have a lot of fiber, such as fresh fruits and vegetables, whole grains, and beans.  Limit foods that are high in fat, low in fiber, or overly processed, such as french fries, hamburgers, cookies, candies, and soda.  Drink enough fluid to keep your urine clear or pale yellow. General instructions  Exercise regularly or as told by your health care provider.  Go to the restroom when you have the urge to go. Do not hold it in.  Take over-the-counter and prescription medicines only as told by your health care provider. These include any fiber supplements.  Practice pelvic floor retraining exercises, such as deep breathing while relaxing the lower abdomen and pelvic floor relaxation during bowel movements.  Watch your condition for any changes.  Keep all follow-up visits as told by your health care provider. This is important. Contact a health care provider if:  You have pain that gets worse.  You have a fever.  You do not have a bowel movement after 4 days.  You vomit.  You are not hungry.  You lose weight.  You are bleeding from the anus.  You have thin, pencil-like stools. Get help right away if:  You have a fever and your symptoms suddenly get worse.  You leak stool or have blood in your stool.  Your abdomen is bloated.  You have severe pain in your abdomen.  You feel dizzy or you faint. This information is not intended to replace advice given to you by your health  care provider. Make sure you discuss any questions you have with your health care provider. Document Revised: 10/05/2017 Document Reviewed: 04/12/2016   Urinary Incontinence  Urinary incontinence refers to a condition in which a person is unable to control where and when to pass urine. A person with this condition will urinate when he or she does not mean to (involuntarily). What are the causes? This condition may be caused by:  Medicines.  Infections.  Constipation.  Overactive bladder muscles.  Weak bladder muscles.  Weak pelvic floor muscles. These muscles provide support for the bladder, intestine, and, in women, the uterus.  Enlarged prostate in men. The prostate is a gland near the bladder. When it gets too big, it can pinch the urethra. With the urethra blocked, the bladder can weaken and lose the ability to empty properly.  Surgery.  Emotional factors, such as anxiety, stress, or post-traumatic stress disorder (PTSD).  Pelvic organ prolapse. This happens in women when organs shift out of place and into the vagina. This shift can prevent the bladder and urethra from working properly. What increases the risk? The following factors may make you more likely to develop this condition:  Older age.  Obesity and physical inactivity.  Pregnancy and childbirth.  Menopause.  Diseases that affect the nerves or spinal cord (neurological diseases).  Long-term (chronic) coughing. This can increase pressure on the bladder and pelvic floor muscles. What are the signs or symptoms? Symptoms may vary depending on  the type of urinary incontinence you have. They include:  A sudden urge to urinate, but passing urine involuntarily before you can get to a bathroom (urge incontinence).  Suddenly passing urine with any activity that forces urine to pass, such as coughing, laughing, exercise, or sneezing (stress incontinence).  Needing to urinate often, but urinating only a small  amount, or constantly dribbling urine (overflow incontinence).  Urinating because you cannot get to the bathroom in time due to a physical disability, such as arthritis or injury, or communication and thinking problems, such as Alzheimer disease (functional incontinence). How is this diagnosed? This condition may be diagnosed based on:  Your medical history.  A physical exam.  Tests, such as: ? Urine tests. ? X-rays of your kidney and bladder. ? Ultrasound. ? CT scan. ? Cystoscopy. In this procedure, a health care provider inserts a tube with a light and camera (cystoscope) through the urethra and into the bladder in order to check for problems. ? Urodynamic testing. These tests assess how well the bladder, urethra, and sphincter can store and release urine. There are different types of urodynamic tests, and they vary depending on what the test is measuring. To help diagnose your condition, your health care provider may recommend that you keep a log of when you urinate and how much you urinate. How is this treated? Treatment for this condition depends on the type of incontinence that you have and its cause. Treatment may include:  Lifestyle changes, such as: ? Quitting smoking. ? Maintaining a healthy weight. ? Staying active. Try to get 150 minutes of moderate-intensity exercise every week. Ask your health care provider which activities are safe for you. ? Eating a healthy diet.  Avoid high-fat foods, like fried foods.  Avoid refined carbohydrates like white bread and white rice.  Limit how much alcohol and caffeine you drink.  Increase your fiber intake. Foods such as fresh fruits, vegetables, beans, and whole grains are healthy sources of fiber.  Pelvic floor muscle exercises.  Bladder training, such as lengthening the amount of time between bathroom breaks, or using the bathroom at regular intervals.  Using techniques to suppress bladder urges. This can include distraction  techniques or controlled breathing exercises.  Medicines to relax the bladder muscles and prevent bladder spasms.  Medicines to help slow or prevent the growth of a man's prostate.  Botox injections. These can help relax the bladder muscles.  Using pulses of electricity to help change bladder reflexes (electrical nerve stimulation).  For women, using a medical device to prevent urine leaks. This is a small, tampon-like, disposable device that is inserted into the urethra.  Injecting collagen or carbon beads (bulking agents) into the urinary sphincter. These can help thicken tissue and close the bladder opening.  Surgery. Follow these instructions at home: Lifestyle  Limit alcohol and caffeine. These can fill your bladder quickly and irritate it.  Keep yourself clean to help prevent odors and skin damage. Ask your doctor about special skin creams and cleansers that can protect the skin from urine.  Consider wearing pads or adult diapers. Make sure to change them regularly, and always change them right after experiencing incontinence. General instructions  Take over-the-counter and prescription medicines only as told by your health care provider.  Use the bathroom about every 3-4 hours, even if you do not feel the need to urinate. Try to empty your bladder completely every time. After urinating, wait a minute. Then try to urinate again.  Make sure you  are in a relaxed position while urinating.  If your incontinence is caused by nerve problems, keep a log of the medicines you take and the times you go to the bathroom.  Keep all follow-up visits as told by your health care provider. This is important. Contact a health care provider if:  You have pain that gets worse.  Your incontinence gets worse. Get help right away if:  You have a fever or chills.  You are unable to urinate.  You have redness in your groin area or down your legs. Summary  Urinary incontinence refers to a  condition in which a person is unable to control where and when to pass urine.  This condition may be caused by medicines, infection, weak bladder muscles, weak pelvic floor muscles, enlargement of the prostate (in men), or surgery.  The following factors increase your risk for developing this condition: older age, obesity, pregnancy and childbirth, menopause, neurological diseases, and chronic coughing.  There are several types of urinary incontinence. They include urge incontinence, stress incontinence, overflow incontinence, and functional incontinence.  This condition is usually treated first with lifestyle and behavioral changes, such as quitting smoking, eating a healthier diet, and doing regular pelvic floor exercises. Other treatment options include medicines, bulking agents, medical devices, electrical nerve stimulation, or surgery. This information is not intended to replace advice given to you by your health care provider. Make sure you discuss any questions you have with your health care provider. Document Revised: 11/02/2017 Document Reviewed: 02/01/2017 Elsevier Patient Education  2020 [employer] Inc.   Pelvic Organ Prolapse Pelvic organ prolapse is the stretching, bulging, or dropping of pelvic organs into an abnormal position. It happens when the muscles and tissues that surround and support pelvic structures become weak or stretched. Pelvic organ prolapse can involve the:  Vagina (vaginal prolapse).  Uterus (uterine prolapse).  Bladder (cystocele).  Rectum (rectocele).  Intestines (enterocele). When organs other than the vagina are involved, they often bulge into the vagina or protrude from the vagina, depending on how severe the prolapse is. What are the causes? This condition may be caused by:  Pregnancy, labor, and childbirth.  Past pelvic surgery.  Decreased production of the hormone estrogen associated with menopause.  Consistently lifting more than 50 lb (23  kg).  Obesity.  Long-term inability to pass stool (chronic constipation).  A cough that lasts a long time (chronic).  Buildup of fluid in the abdomen due to certain diseases and other conditions. What are the signs or symptoms? Symptoms of this condition include:  Passing a little urine (loss of bladder control) when you cough, sneeze, strain, and exercise (stress incontinence). This may be worse immediately after childbirth. It may gradually improve over time.  Feeling pressure in your pelvis or vagina. This pressure may increase when you cough or when you are passing stool.  A bulge that protrudes from the opening of your vagina.  Difficulty passing urine or stool.  Pain in your lower back.  Pain, discomfort, or disinterest in sex.  Repeated bladder infections (urinary tract infections).  Difficulty inserting a tampon. In some people, this condition causes no symptoms. How is this diagnosed? This condition may be diagnosed based on a vaginal and rectal exam. During the exam, you may be asked to cough and strain while you are lying down, sitting, and standing up. Your health care provider will determine if other tests are required, such as bladder function tests. How is this treated? Treatment for this condition may depend  on your symptoms. Treatment may include:  Lifestyle changes, such as changes to your diet.  Emptying your bladder at scheduled times (bladder training therapy). This can help reduce or avoid urinary incontinence.  Estrogen. Estrogen may help mild prolapse by increasing the strength and tone of pelvic floor muscles.  Kegel exercises. These may help mild cases of prolapse by strengthening and tightening the muscles of the pelvic floor.  A soft, flexible device that helps support the vaginal walls and keep pelvic organs in place (pessary). This is inserted into your vagina by your health care provider.  Surgery. This is often the only form of treatment for  severe prolapse. Follow these instructions at home:  Avoid drinking beverages that contain caffeine or alcohol.  Increase your intake of high-fiber foods. This can help decrease constipation and straining during bowel movements.  Lose weight if recommended by your health care provider.  Wear a sanitary pad or adult diapers if you have urinary incontinence.  Avoid heavy lifting and straining with exercise and work. Do not hold your breath when you perform mild to moderate lifting and exercise activities. Limit your activities as directed by your health care provider.  Do Kegel exercises as directed by your health care provider. To do this: ? Squeeze your pelvic floor muscles tight. You should feel a tight lift in your rectal area and a tightness in your vaginal area. Keep your stomach, buttocks, and legs relaxed. ? Hold the muscles tight for up to 10 seconds. ? Relax your muscles. ? Repeat this exercise 50 times a day, or as many times as told by your health care provider. Continue to do this exercise for at least 4-6 weeks, or for as long as told by your health care provider.  Take over-the-counter and prescription medicines only as told by your health care provider.  If you have a pessary, take care of it as told by your health care provider.  Keep all follow-up visits as told by your health care provider. This is important. Contact a health care provider if you:  Have symptoms that interfere with your daily activities or sex life.  Need medicine to help with the discomfort.  Notice bleeding from your vagina that is not related to your period.  Have a fever.  Have pain or bleeding when you urinate.  Have bleeding when you pass stool.  Pass urine when you have sex.  Have chronic constipation.  Have a pessary that falls out.  Have bad smelling vaginal discharge.  Have an unusual, low pain in your abdomen. Summary  Pelvic organ prolapse is the stretching, bulging, or  dropping of pelvic organs into an abnormal position. It happens when the muscles and tissues that surround and support pelvic structures become weak or stretched.  When organs other than the vagina are involved, they often bulge into the vagina or protrude from the vagina, depending on how severe the prolapse is.  In most cases, this condition needs to be treated only if it produces symptoms. Treatment may include lifestyle changes, estrogen, Kegel exercises, pessary insertion, or surgery.  Avoid heavy lifting and straining with exercise and work. Do not hold your breath when you perform mild to moderate lifting and exercise activities. Limit your activities as directed by your health care provider. This information is not intended to replace advice given to you by your health care provider. Make sure you discuss any questions you have with your health care provider. Document Revised: 11/14/2017 Document Reviewed: 11/14/2017 Elsevier  Patient Education  [employer].     Elsevier Patient Education  [employer].

## 2020-11-01 NOTE — Progress Notes (Signed)
GYNECOLOGY  VISIT   HPI: 69 y.o.   Married  Caucasian  female   [phone] with Patient's last menstrual period was 11/06/1998 (approximate).   here for uterine prolapse. Some urinary incontinence. Referred by her PCP.   Notes prolapse for about 2 weeks, onset after vigorous intercourse.   No fecal incontinence.  She does have constipation.   Some urinary incontinence for a couple of year.  Leaks with cough and sneeze.  Cannot hold her urine when she has an urge.  No bladder infections.   Wears a pad.   No vaginal bleeding.  Pain with intercourse.   Enjoys her grandchildren and her flowers.   Had 3 vaginal deliveries and one Cesarean Section.  Largest was 9 pounds, 10 ounces.  Thinks she had an operative delivery with laceration but uncertain if she had a rectal sphincter laceration.   Received her Covid booster vaccine.   GYNECOLOGIC HISTORY: Patient's last menstrual period was 11/06/1998 (approximate). Contraception:  PMP Menopausal hormone therapy:  none Last mammogram: 07-01-18 3D/Neg/density B/birads1 Last pap smear: 12-31-17 Neg, 11-30-08 Neg        OB History    Gravida  4   Para  4   Term  4   Preterm      AB      Living  4     SAB      IAB      Ectopic      Multiple      Live Births                 Patient Active Problem List   Diagnosis Date Noted   Plantar fasciitis, left 04/06/2020   Encounter for smoking cessation counseling 04/06/2020   Yeast dermatitis 08/08/2018   Insomnia 08/08/2018   Situational anxiety 08/08/2018   Bilateral leg cramps 08/08/2018    Past Medical History:  Diagnosis Date   Allergy    Anxiety    Arthritis    Hypertension    Plantar fasciitis    STD (sexually transmitted disease)    treated for chlamydia in her 20's   Urinary incontinence     Past Surgical History:  Procedure Laterality Date   CESAREAN SECTION      Current Outpatient Medications  Medication Sig Dispense Refill    acetaminophen (TYLENOL) 325 MG tablet 1 tablet as needed     albuterol (VENTOLIN HFA) 108 (90 Base) MCG/ACT inhaler Inhale 2 puffs into the lungs every 6 (six) hours as needed for wheezing or shortness of breath. 8 g 2   amLODipine (NORVASC) 10 MG tablet Take 1 tablet by mouth daily.     No current facility-administered medications for this visit.     ALLERGIES: Meloxicam  Family History  Problem Relation Age of Onset   Diabetes Mother    Stroke Father    Cancer Sister    Hypertension Sister    Breast cancer Sister    Diabetes Sister    Hypertension Brother    Hyperlipidemia Sister    Hypertension Sister     Social History   Socioeconomic History   Marital status: Married    Spouse name: Not on file   Number of children: Not on file   Years of education: Not on file   Highest education level: Not on file  Occupational History   Not on file  Tobacco Use   Smoking status: Former Smoker    Types: Cigarettes   Smokeless tobacco: Never Used  Psychologist, educational  Use   Vaping Use: Never used  Substance and Sexual Activity   Alcohol use: No   Drug use: No   Sexual activity: Not Currently    Birth control/protection: Post-menopausal  Other Topics Concern   Not on file  Social History Narrative   Not on file   Social Determinants of Health   Financial Resource Strain: Not on file  Food Insecurity: Not on file  Transportation Needs: Not on file  Physical Activity: Not on file  Stress: Not on file  Social Connections: Not on file  Intimate Partner Violence: Not on file    Review of Systems  Genitourinary:       Urinary incontinence  All other systems reviewed and are negative.   PHYSICAL EXAMINATION:    BP 110/76    Pulse 84    Ht 5' 4.5" (1.638 m)    Wt 158 lb (71.7 kg)    LMP 11/06/1998 (Approximate)    SpO2 92%    BMI 26.70 kg/m     General appearance: alert, cooperative and appears stated age Head: Normocephalic, without obvious abnormality,  atraumatic Lungs: clear to auscultation bilaterally Heart: regular rate and rhythm Abdomen: soft, non-tender, no masses,  no organomegaly Extremities: extremities normal, atraumatic, no cyanosis or edema No abnormal inguinal nodes palpated Neurologic: Grossly normal  Pelvic: External genitalia:  no lesions              Urethra:  normal appearing urethra with no masses, tenderness or lesions              Bartholins and Skenes: normal                 Vagina: normal appearing vagina with normal color and discharge, no lesions.  Second to third degree rectocele.  Good uterine support. First degree rectocele.              Cervix: no lesions                Bimanual Exam:  Uterus:  normal size, contour, position, consistency, mobility, non-tender              Adnexa: no mass, fullness, tenderness              Rectal exam: Yes.  .  Confirms.              Anus:  normal sphincter tone, no lesions  Chaperone was present for exam.  ASSESSMENT  Uterovaginal prolapse.  Genuine stress incontinence. Constipation.   PLAN  3D pelvic model used to explain pelvic floor anatomy and the changes her body has made. We discussed prolapse, stress incontinence, and constipation.  Risk factors of childbearing, chronic straining, lifting reviewed.  High fiber diet, increased water, Miralax, and Colace reviewed for treating constipation.  Treatment options discussed - observation, pelvic floor therapy, pessary, and surgical treatment with a vaginal repair of cystocele and rectocele combined with potential midurethral sling.  Written information to patient as well. She will consider her options and contact me back if she would like to pursue a treatment.  FU prn.   47 min  total time was spent for this patient encounter, including preparation, face-to-face counseling with the patient, coordination of care, and documentation of the encounter.

## 2020-12-14 ENCOUNTER — Encounter: Payer: Self-pay | Admitting: Registered Nurse

## 2020-12-14 ENCOUNTER — Other Ambulatory Visit: Payer: Self-pay

## 2020-12-14 ENCOUNTER — Ambulatory Visit (INDEPENDENT_AMBULATORY_CARE_PROVIDER_SITE_OTHER): Payer: Medicare HMO | Admitting: Registered Nurse

## 2020-12-14 VITALS — BP 127/82 | HR 70 | Temp 98.0°F | Resp 18 | Ht 64.5 in | Wt 157.4 lb

## 2020-12-14 DIAGNOSIS — Z130 Encounter for screening for diseases of the blood and blood-forming organs and certain disorders involving the immune mechanism: Secondary | ICD-10-CM

## 2020-12-14 DIAGNOSIS — Z13220 Encounter for screening for lipoid disorders: Secondary | ICD-10-CM

## 2020-12-14 DIAGNOSIS — Z1329 Encounter for screening for other suspected endocrine disorder: Secondary | ICD-10-CM | POA: Diagnosis not present

## 2020-12-14 DIAGNOSIS — Z13228 Encounter for screening for other metabolic disorders: Secondary | ICD-10-CM | POA: Diagnosis not present

## 2020-12-14 DIAGNOSIS — G479 Sleep disorder, unspecified: Secondary | ICD-10-CM | POA: Diagnosis not present

## 2020-12-14 MED ORDER — ZOLPIDEM TARTRATE 5 MG PO TABS: 5.0000 mg | ORAL_TABLET | Freq: Every evening | ORAL | 2 refills | Status: DC | PRN

## 2020-12-14 NOTE — Patient Instructions (Signed)
° ° ° °  If you have lab work done today you will be contacted with your lab results within the next 2 weeks.  If you have not heard from us then please contact us. The fastest way to get your results is to register for My Chart. ° ° °IF you received an x-ray today, you will receive an invoice from [facility]. Please contact [facility] at [phone] with questions or concerns regarding your invoice.  ° °IF you received labwork today, you will receive an invoice from [facility]. Please contact [facility] at [phone] with questions or concerns regarding your invoice.  ° °Our billing staff will not be able to assist you with questions regarding bills from these companies. ° °You will be contacted with the lab results as soon as they are available. The fastest way to get your results is to activate your My Chart account. Instructions are located on the last page of this paperwork. If you have not heard from us regarding the results in 2 weeks, please contact this office. °  ° ° ° °

## 2020-12-15 LAB — COMPREHENSIVE METABOLIC PANEL
ALT: 6 IU/L (ref 0–32)
AST: 14 IU/L (ref 0–40)
Albumin/Globulin Ratio: 1.7 (ref 1.2–2.2)
Albumin: 4.8 g/dL (ref 3.8–4.8)
Alkaline Phosphatase: 83 IU/L (ref 44–121)
BUN/Creatinine Ratio: 9 — ABNORMAL LOW (ref 12–28)
BUN: 7 mg/dL — ABNORMAL LOW (ref 8–27)
Bilirubin Total: 0.3 mg/dL (ref 0.0–1.2)
CO2: 26 mmol/L (ref 20–29)
Calcium: 9.8 mg/dL (ref 8.7–10.3)
Chloride: 99 mmol/L (ref 96–106)
Creatinine, Ser: 0.82 mg/dL (ref 0.57–1.00)
GFR calc Af Amer: 86 mL/min/{1.73_m2} (ref >59)
GFR calc non Af Amer: 74 mL/min/{1.73_m2} (ref >59)
Globulin, Total: 2.8 g/dL (ref 1.5–4.5)
Glucose: 77 mg/dL (ref 65–99)
Potassium: 4.8 mmol/L (ref 3.5–5.2)
Sodium: 139 mmol/L (ref 134–144)
Total Protein: 7.6 g/dL (ref 6.0–8.5)

## 2020-12-15 LAB — CBC WITH DIFFERENTIAL
Basophils Absolute: 0.1 10*3/uL (ref 0.0–0.2)
Basos: 1 %
EOS (ABSOLUTE): 0.1 10*3/uL (ref 0.0–0.4)
Eos: 1 %
Hematocrit: 44.7 % (ref 34.0–46.6)
Hemoglobin: 14.8 g/dL (ref 11.1–15.9)
Immature Grans (Abs): 0 10*3/uL (ref 0.0–0.1)
Immature Granulocytes: 0 %
Lymphocytes Absolute: 3.5 10*3/uL — ABNORMAL HIGH (ref 0.7–3.1)
Lymphs: 29 %
MCH: 30.6 pg (ref 26.6–33.0)
MCHC: 33.1 g/dL (ref 31.5–35.7)
MCV: 93 fL (ref 79–97)
Monocytes Absolute: 0.6 10*3/uL (ref 0.1–0.9)
Monocytes: 5 %
Neutrophils Absolute: 7.9 10*3/uL — ABNORMAL HIGH (ref 1.4–7.0)
Neutrophils: 64 %
RBC: 4.83 x10E6/uL (ref 3.77–5.28)
RDW: 12.4 % (ref 11.7–15.4)
WBC: 12.1 10*3/uL — ABNORMAL HIGH (ref 3.4–10.8)

## 2020-12-15 LAB — LIPID PANEL
Chol/HDL Ratio: 3.3 ratio (ref 0.0–4.4)
Cholesterol, Total: 255 mg/dL — ABNORMAL HIGH (ref 100–199)
HDL: 77 mg/dL (ref >39)
LDL Chol Calc (NIH): 159 mg/dL — ABNORMAL HIGH (ref 0–99)
Triglycerides: 112 mg/dL (ref 0–149)
VLDL Cholesterol Cal: 19 mg/dL (ref 5–40)

## 2020-12-15 LAB — HEMOGLOBIN A1C
Est. average glucose Bld gHb Est-mCnc: 128 mg/dL
Hgb A1c MFr Bld: 6.1 % — ABNORMAL HIGH (ref 4.8–5.6)

## 2020-12-15 LAB — TSH: TSH: 0.905 u[IU]/mL (ref 0.450–4.500)

## 2020-12-20 ENCOUNTER — Encounter: Payer: Self-pay | Admitting: Registered Nurse

## 2020-12-20 ENCOUNTER — Other Ambulatory Visit: Payer: Self-pay | Admitting: Registered Nurse

## 2020-12-20 DIAGNOSIS — E782 Mixed hyperlipidemia: Secondary | ICD-10-CM

## 2020-12-20 MED ORDER — PRAVASTATIN SODIUM 40 MG PO TABS: 40.0000 mg | ORAL_TABLET | Freq: Every day | ORAL | 3 refills | Status: DC

## 2020-12-22 ENCOUNTER — Telehealth: Payer: Self-pay | Admitting: Registered Nurse

## 2020-12-22 NOTE — Telephone Encounter (Signed)
I have called the pt and informed her that she is able to take those two medication around the same time. She stated understanding.

## 2020-12-22 NOTE — Telephone Encounter (Signed)
Patient called asking should  she space meds out  Pt takes  amLODipine (NORVASC) 10 MG tablet [[id number]  At around 1:00 pm and  And wants to know if its ok to take  pravastatin (PRAVACHOL) 40 MG tablet [[id number]]  At the same time ?        please advise   Please call pt or text her

## 2021-01-03 DIAGNOSIS — F5101 Primary insomnia: Secondary | ICD-10-CM | POA: Diagnosis not present

## 2021-01-03 DIAGNOSIS — I10 Essential (primary) hypertension: Secondary | ICD-10-CM | POA: Diagnosis not present

## 2021-01-03 DIAGNOSIS — F419 Anxiety disorder, unspecified: Secondary | ICD-10-CM | POA: Diagnosis not present

## 2021-01-03 DIAGNOSIS — J301 Allergic rhinitis due to pollen: Secondary | ICD-10-CM | POA: Diagnosis not present

## 2021-01-03 DIAGNOSIS — E7800 Pure hypercholesterolemia, unspecified: Secondary | ICD-10-CM | POA: Diagnosis not present

## 2021-05-10 DIAGNOSIS — N6321 Unspecified lump in the left breast, upper outer quadrant: Secondary | ICD-10-CM | POA: Diagnosis not present

## 2021-05-17 ENCOUNTER — Other Ambulatory Visit: Payer: Self-pay | Admitting: Family Medicine

## 2021-05-17 DIAGNOSIS — N6320 Unspecified lump in the left breast, unspecified quadrant: Secondary | ICD-10-CM

## 2021-06-02 DIAGNOSIS — I10 Essential (primary) hypertension: Secondary | ICD-10-CM | POA: Diagnosis not present

## 2021-06-02 DIAGNOSIS — F5101 Primary insomnia: Secondary | ICD-10-CM | POA: Diagnosis not present

## 2021-06-02 DIAGNOSIS — E7800 Pure hypercholesterolemia, unspecified: Secondary | ICD-10-CM | POA: Diagnosis not present

## 2021-06-02 DIAGNOSIS — F419 Anxiety disorder, unspecified: Secondary | ICD-10-CM | POA: Diagnosis not present

## 2021-06-24 ENCOUNTER — Other Ambulatory Visit: Payer: Self-pay

## 2021-06-24 ENCOUNTER — Ambulatory Visit
Admission: RE | Admit: 2021-06-24 | Discharge: 2021-06-24 | Disposition: A | Discharge status: Home / Self Care | Payer: Medicare HMO | Source: Ambulatory Visit | Attending: Family Medicine | Admitting: Family Medicine

## 2021-06-24 DIAGNOSIS — N6320 Unspecified lump in the left breast, unspecified quadrant: Secondary | ICD-10-CM

## 2021-06-24 DIAGNOSIS — R922 Inconclusive mammogram: Secondary | ICD-10-CM | POA: Diagnosis not present

## 2021-11-28 NOTE — Progress Notes (Signed)
Established Patient Office Visit  Subjective:  Patient ID: Brittany Gomez, female    DOB: 09-Feb-1951  Age: 71 y.o. MRN: [id number]  CC:  Chief Complaint  Patient presents with   Transitions Of Care    Patient states she is here for TOC. Patient states she had covid last month and it has made her insomnia worse and need to discuss Ambien     HPI Brittany Gomez presents for visit to est care  Concern for hx of covid. Onset 1 mo ago Symptoms mostly resolved, but notes it has worsened her insomnia Trouble falling asleep and staying asleep Had been a chronic problem for her in the past. No snoring or breathing issues when sleeping.  Reports good sleep hygiene as able  Past Medical History:  Diagnosis Date   Allergy    Anxiety    Arthritis    Hypertension    Plantar fasciitis    STD (sexually transmitted disease)    treated for chlamydia in her 20's   Urinary incontinence     Past Surgical History:  Procedure Laterality Date   CESAREAN SECTION      Family History  Problem Relation Age of Onset   Diabetes Mother    Stroke Father    Cancer Sister    Hypertension Sister    Breast cancer Sister    Diabetes Sister    Hypertension Brother    Hyperlipidemia Sister    Hypertension Sister     Social History   Socioeconomic History   Marital status: Married    Spouse name: Not on file   Number of children: Not on file   Years of education: Not on file   Highest education level: Not on file  Occupational History   Not on file  Tobacco Use   Smoking status: Former    Types: Cigarettes   Smokeless tobacco: Never  Vaping Use   Vaping Use: Never used  Substance and Sexual Activity   Alcohol use: No   Drug use: No   Sexual activity: Not Currently    Birth control/protection: Post-menopausal  Other Topics Concern   Not on file  Social History Narrative   Not on file   Social Determinants of Health   Financial Resource Strain: Not on file  Food  Insecurity: Not on file  Transportation Needs: Not on file  Physical Activity: Not on file  Stress: Not on file  Social Connections: Not on file  Intimate Partner Violence: Not on file    Outpatient Medications Prior to Visit  Medication Sig Dispense Refill   acetaminophen (TYLENOL) 325 MG tablet 1 tablet as needed     albuterol (VENTOLIN HFA) 108 (90 Base) MCG/ACT inhaler Inhale 2 puffs into the lungs every 6 (six) hours as needed for wheezing or shortness of breath. 8 g 2   amLODipine (NORVASC) 10 MG tablet Take 1 tablet by mouth daily.     No facility-administered medications prior to visit.    Allergies  Allergen Reactions   Meloxicam Other (See Comments)    ROS Review of Systems  Constitutional: Negative.   HENT: Negative.    Eyes: Negative.   Respiratory: Negative.    Cardiovascular: Negative.   Gastrointestinal: Negative.   Genitourinary: Negative.   Musculoskeletal: Negative.   Skin: Negative.   Neurological: Negative.   Psychiatric/Behavioral: Negative.    All other systems reviewed and are negative.    Objective:    Physical Exam Vitals and nursing note reviewed.  Constitutional:      General: She is not in acute distress.    Appearance: Normal appearance. She is normal weight. She is not ill-appearing, toxic-appearing or diaphoretic.  Cardiovascular:     Rate and Rhythm: Normal rate and regular rhythm.     Heart sounds: Normal heart sounds. No murmur heard.   No friction rub. No gallop.  Pulmonary:     Effort: Pulmonary effort is normal. No respiratory distress.     Breath sounds: Normal breath sounds. No stridor. No wheezing, rhonchi or rales.  Chest:     Chest wall: No tenderness.  Skin:    General: Skin is warm and dry.  Neurological:     General: No focal deficit present.     Mental Status: She is alert and oriented to person, place, and time. Mental status is at baseline.  Psychiatric:        Mood and Affect: Mood normal.        Behavior:  Behavior normal.        Thought Content: Thought content normal.        Judgment: Judgment normal.    BP 127/82    Pulse 70    Temp 98 F (36.7 C) (Temporal)    Resp 18    Ht 5' 4.5" (1.638 m)    Wt 157 lb 6.4 oz (71.4 kg)    LMP 11/06/1998 (Approximate)    SpO2 96%    BMI 26.60 kg/m  Wt Readings from Last 3 Encounters:  12/14/20 157 lb 6.4 oz (71.4 kg)  11/01/20 158 lb (71.7 kg)  10/21/20 155 lb 6.4 oz (70.5 kg)     Health Maintenance Due  Topic Date Due   Pneumonia Vaccine 76+ Years old (1 - PCV) Never done   COLONOSCOPY (Pts 45-6yrs Insurance coverage will need to be confirmed)  Never done   Zoster Vaccines- Shingrix (1 of 2) Never done   COVID-19 Vaccine (3 - Booster for Pfizer series) 03/30/2020   INFLUENZA VACCINE  06/06/2021    There are no preventive care reminders to display for this patient.  Lab Results  Component Value Date   TSH 0.905 12/14/2020   Lab Results  Component Value Date   WBC 12.1 (H) 12/14/2020   HGB 14.8 12/14/2020   HCT 44.7 12/14/2020   MCV 93 12/14/2020   PLT 341 08/04/2019   Lab Results  Component Value Date   NA 139 12/14/2020   K 4.8 12/14/2020   CO2 26 12/14/2020   GLUCOSE 77 12/14/2020   BUN 7 (L) 12/14/2020   CREATININE 0.82 12/14/2020   BILITOT 0.3 12/14/2020   ALKPHOS 83 12/14/2020   AST 14 12/14/2020   ALT 6 12/14/2020   PROT 7.6 12/14/2020   ALBUMIN 4.8 12/14/2020   CALCIUM 9.8 12/14/2020   Lab Results  Component Value Date   CHOL 255 (H) 12/14/2020   Lab Results  Component Value Date   HDL 77 12/14/2020   Lab Results  Component Value Date   LDLCALC 159 (H) 12/14/2020   Lab Results  Component Value Date   TRIG 112 12/14/2020   Lab Results  Component Value Date   CHOLHDL 3.3 12/14/2020   Lab Results  Component Value Date   HGBA1C 6.1 (H) 12/14/2020      Assessment & Plan:   Problem List Items Addressed This Visit   None Visit Diagnoses     Screening for endocrine, metabolic and immunity  disorder    -  Primary  Relevant Orders   CBC With Differential (Completed)   Comprehensive metabolic panel (Completed)   Hemoglobin A1c (Completed)   TSH (Completed)   Lipid screening       Relevant Orders   Lipid panel (Completed)   Sleep disturbance       Relevant Medications   zolpidem (AMBIEN) 5 MG tablet       Meds ordered this encounter  Medications   zolpidem (AMBIEN) 5 MG tablet    Sig: Take 1 tablet (5 mg total) by mouth at bedtime as needed for sleep.    Dispense:  30 tablet    Refill:  2    Ambien brand, please. Pt preference.    Order Specific Question:   Supervising Provider    Answer:   GREENE, JEFNeva SeatFREY R [[id number]]    Follow-up: No follow-ups on file.   PLAN Labs collected. Will follow up with the patient as warranted. Has used zolpidem in the past with good effect. Discussed risks, benefits, and alternatives with patient who voices understanding. Understands that this will be a short course and not a long term medication. Patient encouraged to call clinic with any questions, comments, or concerns.  Brittany Ageeichard Claudetta Sallie, NP

## 2021-12-12 DIAGNOSIS — I10 Essential (primary) hypertension: Secondary | ICD-10-CM | POA: Diagnosis not present

## 2021-12-12 DIAGNOSIS — E7800 Pure hypercholesterolemia, unspecified: Secondary | ICD-10-CM | POA: Diagnosis not present

## 2021-12-12 DIAGNOSIS — F5101 Primary insomnia: Secondary | ICD-10-CM | POA: Diagnosis not present

## 2021-12-12 DIAGNOSIS — Z0000 Encounter for general adult medical examination without abnormal findings: Secondary | ICD-10-CM | POA: Diagnosis not present

## 2021-12-12 DIAGNOSIS — M5432 Sciatica, left side: Secondary | ICD-10-CM | POA: Diagnosis not present

## 2021-12-12 DIAGNOSIS — F419 Anxiety disorder, unspecified: Secondary | ICD-10-CM | POA: Diagnosis not present

## 2021-12-12 DIAGNOSIS — R8281 Pyuria: Secondary | ICD-10-CM | POA: Diagnosis not present

## 2021-12-12 DIAGNOSIS — Z1211 Encounter for screening for malignant neoplasm of colon: Secondary | ICD-10-CM | POA: Diagnosis not present

## 2021-12-12 DIAGNOSIS — R0789 Other chest pain: Secondary | ICD-10-CM | POA: Diagnosis not present

## 2021-12-13 ENCOUNTER — Ambulatory Visit
Admission: RE | Admit: 2021-12-13 | Discharge: 2021-12-13 | Disposition: A | Discharge status: Home / Self Care | Payer: Medicare HMO | Source: Ambulatory Visit | Attending: Family Medicine | Admitting: Family Medicine

## 2021-12-13 ENCOUNTER — Other Ambulatory Visit: Payer: Self-pay | Admitting: Family Medicine

## 2021-12-13 ENCOUNTER — Other Ambulatory Visit: Payer: Self-pay

## 2021-12-13 DIAGNOSIS — R0789 Other chest pain: Secondary | ICD-10-CM

## 2021-12-13 DIAGNOSIS — R0781 Pleurodynia: Secondary | ICD-10-CM | POA: Diagnosis not present

## 2021-12-25 ENCOUNTER — Other Ambulatory Visit: Payer: Self-pay | Admitting: Registered Nurse

## 2021-12-25 DIAGNOSIS — E782 Mixed hyperlipidemia: Secondary | ICD-10-CM

## 2021-12-26 NOTE — Telephone Encounter (Signed)
Patient needs appointment for further refills

## 2022-02-01 DIAGNOSIS — Z1211 Encounter for screening for malignant neoplasm of colon: Secondary | ICD-10-CM | POA: Diagnosis not present

## 2022-02-21 DIAGNOSIS — R0789 Other chest pain: Secondary | ICD-10-CM | POA: Diagnosis not present

## 2022-03-24 ENCOUNTER — Other Ambulatory Visit: Payer: Self-pay | Admitting: Registered Nurse

## 2022-03-24 DIAGNOSIS — E782 Mixed hyperlipidemia: Secondary | ICD-10-CM

## 2022-05-20 ENCOUNTER — Other Ambulatory Visit: Payer: Self-pay | Admitting: Registered Nurse

## 2022-05-20 DIAGNOSIS — E782 Mixed hyperlipidemia: Secondary | ICD-10-CM

## 2022-06-01 DIAGNOSIS — I10 Essential (primary) hypertension: Secondary | ICD-10-CM | POA: Diagnosis not present

## 2022-06-01 DIAGNOSIS — F5101 Primary insomnia: Secondary | ICD-10-CM | POA: Diagnosis not present

## 2022-06-01 DIAGNOSIS — E7800 Pure hypercholesterolemia, unspecified: Secondary | ICD-10-CM | POA: Diagnosis not present

## 2022-06-01 DIAGNOSIS — J301 Allergic rhinitis due to pollen: Secondary | ICD-10-CM | POA: Diagnosis not present

## 2022-06-01 DIAGNOSIS — F419 Anxiety disorder, unspecified: Secondary | ICD-10-CM | POA: Diagnosis not present

## 2022-06-30 DIAGNOSIS — M62830 Muscle spasm of back: Secondary | ICD-10-CM | POA: Diagnosis not present

## 2022-06-30 DIAGNOSIS — M5441 Lumbago with sciatica, right side: Secondary | ICD-10-CM | POA: Diagnosis not present

## 2022-08-12 DIAGNOSIS — N760 Acute vaginitis: Secondary | ICD-10-CM | POA: Diagnosis not present

## 2022-08-12 DIAGNOSIS — N390 Urinary tract infection, site not specified: Secondary | ICD-10-CM | POA: Diagnosis not present

## 2022-08-12 DIAGNOSIS — M5441 Lumbago with sciatica, right side: Secondary | ICD-10-CM | POA: Diagnosis not present

## 2022-08-18 DIAGNOSIS — F331 Major depressive disorder, recurrent, moderate: Secondary | ICD-10-CM | POA: Diagnosis not present

## 2022-08-18 DIAGNOSIS — Z7721 Contact with and (suspected) exposure to potentially hazardous body fluids: Secondary | ICD-10-CM | POA: Diagnosis not present

## 2022-08-18 DIAGNOSIS — M5441 Lumbago with sciatica, right side: Secondary | ICD-10-CM | POA: Diagnosis not present

## 2022-08-18 DIAGNOSIS — N760 Acute vaginitis: Secondary | ICD-10-CM | POA: Diagnosis not present

## 2022-09-16 DIAGNOSIS — J0100 Acute maxillary sinusitis, unspecified: Secondary | ICD-10-CM | POA: Diagnosis not present

## 2022-09-16 DIAGNOSIS — M549 Dorsalgia, unspecified: Secondary | ICD-10-CM | POA: Diagnosis not present

## 2022-09-19 DIAGNOSIS — I10 Essential (primary) hypertension: Secondary | ICD-10-CM | POA: Diagnosis not present

## 2022-09-19 DIAGNOSIS — F5101 Primary insomnia: Secondary | ICD-10-CM | POA: Diagnosis not present

## 2022-09-19 DIAGNOSIS — E7800 Pure hypercholesterolemia, unspecified: Secondary | ICD-10-CM | POA: Diagnosis not present

## 2022-09-19 DIAGNOSIS — N814 Uterovaginal prolapse, unspecified: Secondary | ICD-10-CM | POA: Diagnosis not present

## 2022-09-19 DIAGNOSIS — F419 Anxiety disorder, unspecified: Secondary | ICD-10-CM | POA: Diagnosis not present

## 2022-09-19 DIAGNOSIS — M5441 Lumbago with sciatica, right side: Secondary | ICD-10-CM | POA: Diagnosis not present

## 2022-12-19 DIAGNOSIS — Z0000 Encounter for general adult medical examination without abnormal findings: Secondary | ICD-10-CM | POA: Diagnosis not present

## 2022-12-19 DIAGNOSIS — Z23 Encounter for immunization: Secondary | ICD-10-CM | POA: Diagnosis not present

## 2022-12-19 DIAGNOSIS — J301 Allergic rhinitis due to pollen: Secondary | ICD-10-CM | POA: Diagnosis not present

## 2022-12-19 DIAGNOSIS — F5101 Primary insomnia: Secondary | ICD-10-CM | POA: Diagnosis not present

## 2022-12-19 DIAGNOSIS — N390 Urinary tract infection, site not specified: Secondary | ICD-10-CM | POA: Diagnosis not present

## 2022-12-19 DIAGNOSIS — M85852 Other specified disorders of bone density and structure, left thigh: Secondary | ICD-10-CM | POA: Diagnosis not present

## 2022-12-19 DIAGNOSIS — E7800 Pure hypercholesterolemia, unspecified: Secondary | ICD-10-CM | POA: Diagnosis not present

## 2022-12-19 DIAGNOSIS — I10 Essential (primary) hypertension: Secondary | ICD-10-CM | POA: Diagnosis not present

## 2022-12-19 DIAGNOSIS — F419 Anxiety disorder, unspecified: Secondary | ICD-10-CM | POA: Diagnosis not present

## 2022-12-21 ENCOUNTER — Other Ambulatory Visit: Payer: Self-pay | Admitting: Family Medicine

## 2022-12-21 DIAGNOSIS — M85852 Other specified disorders of bone density and structure, left thigh: Secondary | ICD-10-CM

## 2022-12-21 DIAGNOSIS — Z1231 Encounter for screening mammogram for malignant neoplasm of breast: Secondary | ICD-10-CM

## 2023-01-25 ENCOUNTER — Other Ambulatory Visit: Payer: Self-pay

## 2023-01-25 DIAGNOSIS — Z87891 Personal history of nicotine dependence: Secondary | ICD-10-CM

## 2023-01-25 DIAGNOSIS — F17210 Nicotine dependence, cigarettes, uncomplicated: Secondary | ICD-10-CM

## 2023-01-26 DIAGNOSIS — Z1211 Encounter for screening for malignant neoplasm of colon: Secondary | ICD-10-CM | POA: Diagnosis not present

## 2023-02-21 ENCOUNTER — Ambulatory Visit
Admission: RE | Admit: 2023-02-21 | Discharge: 2023-02-21 | Disposition: A | Discharge status: Home / Self Care | Payer: Medicare HMO | Source: Ambulatory Visit | Attending: Family Medicine | Admitting: Family Medicine

## 2023-02-21 DIAGNOSIS — Z1231 Encounter for screening mammogram for malignant neoplasm of breast: Secondary | ICD-10-CM | POA: Diagnosis not present

## 2023-03-02 DIAGNOSIS — B372 Candidiasis of skin and nail: Secondary | ICD-10-CM | POA: Diagnosis not present

## 2023-03-06 ENCOUNTER — Encounter: Payer: Self-pay | Admitting: Acute Care

## 2023-03-06 ENCOUNTER — Ambulatory Visit (INDEPENDENT_AMBULATORY_CARE_PROVIDER_SITE_OTHER): Payer: Medicare HMO | Admitting: Acute Care

## 2023-03-06 DIAGNOSIS — F17210 Nicotine dependence, cigarettes, uncomplicated: Secondary | ICD-10-CM

## 2023-03-06 NOTE — Patient Instructions (Signed)
Thank you for participating in the [facility]. It was our pleasure to meet you today. We will call you with the results of your scan within the next few days. Your scan will be assigned a Lung RADS category score by the physicians reading the scans.  This Lung RADS score determines follow up scanning.  See below for description of categories, and follow up screening recommendations. We will be in touch to schedule your follow up screening annually or based on recommendations of our providers. We will fax a copy of your scan results to your Primary Care Physician, or the physician who referred you to the program, to ensure they have the results. Please call the office if you have any questions or concerns regarding your scanning experience or results.  Our office number is [phone]. Please speak with Denise Phelps, RN. , or  Denise Buckner RN, They are  our Lung Cancer Screening RN.'s If They are unavailable when you call, Please leave a message on the voice mail. We will return your call at our earliest convenience.This voice mail is monitored several times a day.  Remember, if your scan is normal, we will scan you annually as long as you continue to meet the criteria for the program. (Age 50-80, Current smoker or smoker who has quit within the last 15 years). If you are a smoker, remember, quitting is the single most powerful action that you can take to decrease your risk of lung cancer and other pulmonary, breathing related problems. We know quitting is hard, and we are here to help.  Please let us know if there is anything we can do to help you meet your goal of quitting. If you are a former smoker, congratulations. We are proud of you! Remain smoke free! Remember you can refer friends or family members through the number above.  We will screen them to make sure they meet criteria for the program. Thank you for helping us take better care of you by  participating in Lung Screening.  You can receive free nicotine replacement therapy ( patches, gum or mints) by calling 1-800-QUIT NOW. Please call so we can get you on the path to becoming  a non-smoker. I know it is hard, but you can do this!  Lung RADS Categories:  Lung RADS 1: no nodules or definitely non-concerning nodules.  Recommendation is for a repeat annual scan in 12 months.  Lung RADS 2:  nodules that are non-concerning in appearance and behavior with a very low likelihood of becoming an active cancer. Recommendation is for a repeat annual scan in 12 months.  Lung RADS 3: nodules that are probably non-concerning , includes nodules with a low likelihood of becoming an active cancer.  Recommendation is for a 6-month repeat screening scan. Often noted after an upper respiratory illness. We will be in touch to make sure you have no questions, and to schedule your 6-month scan.  Lung RADS 4 A: nodules with concerning findings, recommendation is most often for a follow up scan in 3 months or additional testing based on our provider's assessment of the scan. We will be in touch to make sure you have no questions and to schedule the recommended 3 month follow up scan.  Lung RADS 4 B:  indicates findings that are concerning. We will be in touch with you to schedule additional diagnostic testing based on our provider's  assessment of the scan.  Other options for assistance in smoking cessation (   As covered by your insurance benefits)  Hypnosis for smoking cessation  [employer] [phone]  Acupuncture for smoking cessation  [facility] [phone]   

## 2023-03-06 NOTE — Progress Notes (Signed)
Virtual Visit via Telephone Note  I connected with Sheniece Ruggles on 03/06/23 at  1:30 PM EDT by telephone and verified that I am speaking with the correct person using two identifiers.  Location: Patient:  At home Provider: [address] [place], [place], [address]    I discussed the limitations, risks, security and privacy concerns of performing an evaluation and management service by telephone and the availability of in person appointments. I also discussed with the patient that there may be a patient responsible charge related to this service. The patient expressed understanding and agreed to proceed.   Shared Decision Making Visit Lung Cancer Screening Program [phone])   Eligibility: Age 72 y.o. Pack Years Smoking History Calculation 38 pack year smoking history (# packs/per year x # years smoked) Recent History of coughing up blood  no Unexplained weight loss? no ( >Than 15 pounds within the last 6 months ) Prior History Lung / other cancer no (Diagnosis within the last 5 years already requiring surveillance chest CT Scans). Smoking Status Current Smoker Former Smokers: Years since quit:  NA  Quit Date:  NA  Visit Components: Discussion included one or more decision making aids. yes Discussion included risk/benefits of screening. yes Discussion included potential follow up diagnostic testing for abnormal scans. yes Discussion included meaning and risk of over diagnosis. yes Discussion included meaning and risk of False Positives. yes Discussion included meaning of total radiation exposure. yes  Counseling Included: Importance of adherence to annual lung cancer LDCT screening. yes Impact of comorbidities on ability to participate in the program. yes Ability and willingness to under diagnostic treatment. yes  Smoking Cessation Counseling: Current Smokers:  Discussed importance of smoking cessation. yes Information about tobacco cessation classes and  interventions provided to patient. yes Patient provided with "ticket" for LDCT Scan. yes Symptomatic Patient. no  Counseling NA Diagnosis Code: Tobacco Use Z72.0 Asymptomatic Patient yes  Counseling (Intermediate counseling: > three minutes counseling) U0454 Former Smokers:  Discussed the importance of maintaining cigarette abstinence. yes Diagnosis Code: Personal History of Nicotine Dependence. U98.119 Information about tobacco cessation classes and interventions provided to patient. Yes Patient provided with "ticket" for LDCT Scan. yes Written Order for Lung Cancer Screening with LDCT placed in Epic. Yes (CT Chest Lung Cancer Screening Low Dose W/O CM) [id number] Z12.2-Screening of respiratory organs Z87.891-Personal history of nicotine dependence  I have spent 25 minutes of face to face/ virtual visit   time with  Ms. Strength discussing the risks and benefits of lung cancer screening. We viewed / discussed a power point together that explained in detail the above noted topics. We paused at intervals to allow for questions to be asked and answered to ensure understanding.We discussed that the single most powerful action that she can take to decrease her risk of developing lung cancer is to quit smoking. We discussed whether or not she is ready to commit to setting a quit date. We discussed options for tools to aid in quitting smoking including nicotine replacement therapy, non-nicotine medications, support groups, Quit Smart classes, and behavior modification. We discussed that often times setting smaller, more achievable goals, such as eliminating 1 cigarette a day for a week and then 2 cigarettes a day for a week can be helpful in slowly decreasing the number of cigarettes smoked. This allows for a sense of accomplishment as well as providing a clinical benefit. I provided  her  with smoking cessation  information  with contact information for community resources, classes,  free nicotine replacement  therapy, and access to mobile apps, text messaging, and on-line smoking cessation help. I have also provided  her  the office contact information in the event she needs to contact me, or the screening staff. We discussed the time and location of the scan, and that either Abigail Miyamoto RN, Karlton Lemon, RN  or I will call / send a letter with the results within 24-72 hours of receiving them. The patient verbalized understanding of all of  the above and had no further questions upon leaving the office. They have my contact information in the event they have any further questions.  I spent 3 minutes counseling on smoking cessation and the health risks of continued tobacco abuse.  I explained to the patient that there has been a high incidence of coronary artery disease noted on these exams. I explained that this is a non-gated exam therefore degree or severity cannot be determined. This patient is on statin therapy. I have asked the patient to follow-up with their PCP regarding any incidental finding of coronary artery disease and management with diet or medication as their PCP  feels is clinically indicated. The patient verbalized understanding of the above and had no further questions upon completion of the visit.  Pt interested in hypnosis for smoking cessation, number provided.      Bevelyn Ngo, NP 03/06/2023

## 2023-03-07 ENCOUNTER — Ambulatory Visit
Admission: RE | Admit: 2023-03-07 | Discharge: 2023-03-07 | Disposition: A | Discharge status: Home / Self Care | Payer: Medicare HMO | Source: Ambulatory Visit

## 2023-03-07 DIAGNOSIS — Z87891 Personal history of nicotine dependence: Secondary | ICD-10-CM

## 2023-03-07 DIAGNOSIS — F17210 Nicotine dependence, cigarettes, uncomplicated: Secondary | ICD-10-CM

## 2023-03-07 DIAGNOSIS — Z122 Encounter for screening for malignant neoplasm of respiratory organs: Secondary | ICD-10-CM | POA: Diagnosis not present

## 2023-03-12 ENCOUNTER — Telehealth: Payer: Self-pay | Admitting: Acute Care

## 2023-03-12 NOTE — Telephone Encounter (Signed)
CT call report

## 2023-03-12 NOTE — Telephone Encounter (Signed)
Spoke to [facility]. Call report going to Kandice Robinsons, NP

## 2023-04-12 ENCOUNTER — Telehealth: Payer: Self-pay | Admitting: Acute Care

## 2023-04-12 DIAGNOSIS — Z87891 Personal history of nicotine dependence: Secondary | ICD-10-CM

## 2023-04-12 DIAGNOSIS — Z122 Encounter for screening for malignant neoplasm of respiratory organs: Secondary | ICD-10-CM

## 2023-04-12 NOTE — Telephone Encounter (Signed)
Order placed for 6 month nodule follow up scan. Results and plan have been sent to PCP. Nothing further needed at this time.

## 2023-04-12 NOTE — Telephone Encounter (Signed)
I have called the patient with the results of her low-dose screening CT.  Her scan was read as a lung RADS three 22-month follow up recommended.  There is an irregular dominant nodule in the peripheral right upper lobe measuring 6.3 mm.  This is too small to PET therefore recommended 37-month follow-up will be scheduled for November 2024. Patient is in agreement with this plan. There was also notation of a mildly enlarged precarinal lymph node that is likely reactive, radiology recommended attention at 37-month follow-up CT. There was also notation of enlargement of the pulmonary outflow tract/main pulmonary arteries which is suggestive of pulmonary artery hypertension. I do not see echo throughout the epic EMR. Plan will be for attention at 20-month follow-up, if noted again we will refer her to pulmonary hypertension specialist within the pulmonary department. Patient is asymptomatic, states she is rarely short of breath. Sherre Lain, and [place].  Please fax results to PCP let them know plan is for 78-month follow-up screening scan. Thanks so much

## 2023-04-30 DIAGNOSIS — F418 Other specified anxiety disorders: Secondary | ICD-10-CM | POA: Diagnosis not present

## 2023-04-30 DIAGNOSIS — N898 Other specified noninflammatory disorders of vagina: Secondary | ICD-10-CM | POA: Diagnosis not present

## 2023-05-22 ENCOUNTER — Ambulatory Visit
Admission: RE | Admit: 2023-05-22 | Discharge: 2023-05-22 | Disposition: A | Discharge status: Home / Self Care | Payer: Medicare HMO | Source: Ambulatory Visit | Attending: Family Medicine | Admitting: Family Medicine

## 2023-05-22 DIAGNOSIS — N958 Other specified menopausal and perimenopausal disorders: Secondary | ICD-10-CM | POA: Diagnosis not present

## 2023-05-22 DIAGNOSIS — E349 Endocrine disorder, unspecified: Secondary | ICD-10-CM | POA: Diagnosis not present

## 2023-05-22 DIAGNOSIS — M85852 Other specified disorders of bone density and structure, left thigh: Secondary | ICD-10-CM

## 2023-05-22 DIAGNOSIS — M8588 Other specified disorders of bone density and structure, other site: Secondary | ICD-10-CM | POA: Diagnosis not present

## 2023-06-25 DIAGNOSIS — I10 Essential (primary) hypertension: Secondary | ICD-10-CM | POA: Diagnosis not present

## 2023-06-25 DIAGNOSIS — F411 Generalized anxiety disorder: Secondary | ICD-10-CM | POA: Diagnosis not present

## 2023-06-25 DIAGNOSIS — E7800 Pure hypercholesterolemia, unspecified: Secondary | ICD-10-CM | POA: Diagnosis not present

## 2023-10-12 ENCOUNTER — Ambulatory Visit
Admission: RE | Admit: 2023-10-12 | Discharge: 2023-10-12 | Disposition: A | Discharge status: Home / Self Care | Payer: Medicare HMO | Source: Ambulatory Visit | Attending: Acute Care | Admitting: Acute Care

## 2023-10-12 DIAGNOSIS — R918 Other nonspecific abnormal finding of lung field: Secondary | ICD-10-CM | POA: Diagnosis not present

## 2023-10-12 DIAGNOSIS — Z122 Encounter for screening for malignant neoplasm of respiratory organs: Secondary | ICD-10-CM

## 2023-10-12 DIAGNOSIS — Z87891 Personal history of nicotine dependence: Secondary | ICD-10-CM

## 2023-10-12 DIAGNOSIS — F17210 Nicotine dependence, cigarettes, uncomplicated: Secondary | ICD-10-CM | POA: Diagnosis not present

## 2023-10-12 DIAGNOSIS — I2510 Atherosclerotic heart disease of native coronary artery without angina pectoris: Secondary | ICD-10-CM | POA: Diagnosis not present

## 2023-10-26 ENCOUNTER — Other Ambulatory Visit: Payer: Self-pay | Admitting: Emergency Medicine

## 2023-10-26 DIAGNOSIS — Z122 Encounter for screening for malignant neoplasm of respiratory organs: Secondary | ICD-10-CM

## 2023-10-26 DIAGNOSIS — Z87891 Personal history of nicotine dependence: Secondary | ICD-10-CM

## 2023-12-25 DIAGNOSIS — Z0000 Encounter for general adult medical examination without abnormal findings: Secondary | ICD-10-CM | POA: Diagnosis not present

## 2023-12-25 DIAGNOSIS — E7800 Pure hypercholesterolemia, unspecified: Secondary | ICD-10-CM | POA: Diagnosis not present

## 2023-12-25 DIAGNOSIS — F419 Anxiety disorder, unspecified: Secondary | ICD-10-CM | POA: Diagnosis not present

## 2023-12-25 DIAGNOSIS — I10 Essential (primary) hypertension: Secondary | ICD-10-CM | POA: Diagnosis not present

## 2023-12-25 DIAGNOSIS — M25511 Pain in right shoulder: Secondary | ICD-10-CM | POA: Diagnosis not present

## 2024-01-11 DIAGNOSIS — R0602 Shortness of breath: Secondary | ICD-10-CM | POA: Diagnosis not present

## 2024-01-11 DIAGNOSIS — R0902 Hypoxemia: Secondary | ICD-10-CM | POA: Diagnosis not present

## 2024-01-12 DIAGNOSIS — J101 Influenza due to other identified influenza virus with other respiratory manifestations: Secondary | ICD-10-CM | POA: Diagnosis not present

## 2024-01-12 DIAGNOSIS — H65192 Other acute nonsuppurative otitis media, left ear: Secondary | ICD-10-CM | POA: Diagnosis not present

## 2024-02-06 DIAGNOSIS — Z1211 Encounter for screening for malignant neoplasm of colon: Secondary | ICD-10-CM | POA: Diagnosis not present

## 2024-05-01 DIAGNOSIS — I10 Essential (primary) hypertension: Secondary | ICD-10-CM | POA: Diagnosis not present

## 2024-05-05 DIAGNOSIS — I10 Essential (primary) hypertension: Secondary | ICD-10-CM | POA: Diagnosis not present

## 2024-05-05 DIAGNOSIS — E7800 Pure hypercholesterolemia, unspecified: Secondary | ICD-10-CM | POA: Diagnosis not present

## 2024-05-30 DIAGNOSIS — I10 Essential (primary) hypertension: Secondary | ICD-10-CM | POA: Diagnosis not present

## 2024-06-05 DIAGNOSIS — I10 Essential (primary) hypertension: Secondary | ICD-10-CM | POA: Diagnosis not present

## 2024-06-05 DIAGNOSIS — E7800 Pure hypercholesterolemia, unspecified: Secondary | ICD-10-CM | POA: Diagnosis not present

## 2024-06-24 DIAGNOSIS — F419 Anxiety disorder, unspecified: Secondary | ICD-10-CM | POA: Diagnosis not present

## 2024-06-24 DIAGNOSIS — F5101 Primary insomnia: Secondary | ICD-10-CM | POA: Diagnosis not present

## 2024-06-24 DIAGNOSIS — M5441 Lumbago with sciatica, right side: Secondary | ICD-10-CM | POA: Diagnosis not present

## 2024-06-24 DIAGNOSIS — E7800 Pure hypercholesterolemia, unspecified: Secondary | ICD-10-CM | POA: Diagnosis not present

## 2024-06-24 DIAGNOSIS — I10 Essential (primary) hypertension: Secondary | ICD-10-CM | POA: Diagnosis not present

## 2024-06-24 DIAGNOSIS — J301 Allergic rhinitis due to pollen: Secondary | ICD-10-CM | POA: Diagnosis not present

## 2024-06-24 DIAGNOSIS — G8929 Other chronic pain: Secondary | ICD-10-CM | POA: Diagnosis not present

## 2024-06-29 DIAGNOSIS — I10 Essential (primary) hypertension: Secondary | ICD-10-CM | POA: Diagnosis not present

## 2024-07-06 DIAGNOSIS — I10 Essential (primary) hypertension: Secondary | ICD-10-CM | POA: Diagnosis not present

## 2024-07-06 DIAGNOSIS — E7800 Pure hypercholesterolemia, unspecified: Secondary | ICD-10-CM | POA: Diagnosis not present

## 2024-07-29 DIAGNOSIS — I10 Essential (primary) hypertension: Secondary | ICD-10-CM | POA: Diagnosis not present

## 2024-08-05 DIAGNOSIS — I10 Essential (primary) hypertension: Secondary | ICD-10-CM | POA: Diagnosis not present

## 2024-08-05 DIAGNOSIS — E7800 Pure hypercholesterolemia, unspecified: Secondary | ICD-10-CM | POA: Diagnosis not present

## 2024-08-28 DIAGNOSIS — I10 Essential (primary) hypertension: Secondary | ICD-10-CM | POA: Diagnosis not present

## 2024-09-05 DIAGNOSIS — I10 Essential (primary) hypertension: Secondary | ICD-10-CM | POA: Diagnosis not present

## 2024-09-05 DIAGNOSIS — E7800 Pure hypercholesterolemia, unspecified: Secondary | ICD-10-CM | POA: Diagnosis not present

## 2024-09-27 DIAGNOSIS — I10 Essential (primary) hypertension: Secondary | ICD-10-CM | POA: Diagnosis not present

## 2024-10-05 DIAGNOSIS — I10 Essential (primary) hypertension: Secondary | ICD-10-CM | POA: Diagnosis not present

## 2024-10-05 DIAGNOSIS — E7800 Pure hypercholesterolemia, unspecified: Secondary | ICD-10-CM | POA: Diagnosis not present

## 2024-10-13 ENCOUNTER — Inpatient Hospital Stay: Admission: RE | Admit: 2024-10-13 | Source: Ambulatory Visit

## 2024-10-15 ENCOUNTER — Inpatient Hospital Stay: Admission: RE | Admit: 2024-10-15 | Discharge: 2024-10-15 | Attending: Acute Care | Admitting: Acute Care

## 2024-10-15 DIAGNOSIS — Z122 Encounter for screening for malignant neoplasm of respiratory organs: Secondary | ICD-10-CM

## 2024-10-15 DIAGNOSIS — Z87891 Personal history of nicotine dependence: Secondary | ICD-10-CM

## 2024-10-16 DIAGNOSIS — R634 Abnormal weight loss: Secondary | ICD-10-CM | POA: Diagnosis not present

## 2024-10-21 ENCOUNTER — Other Ambulatory Visit: Payer: Self-pay

## 2024-10-21 DIAGNOSIS — F17210 Nicotine dependence, cigarettes, uncomplicated: Secondary | ICD-10-CM

## 2024-10-21 DIAGNOSIS — Z87891 Personal history of nicotine dependence: Secondary | ICD-10-CM

## 2024-10-21 DIAGNOSIS — Z122 Encounter for screening for malignant neoplasm of respiratory organs: Secondary | ICD-10-CM

## 2024-10-28 ENCOUNTER — Emergency Department (HOSPITAL_COMMUNITY)

## 2024-10-28 ENCOUNTER — Other Ambulatory Visit: Payer: Self-pay

## 2024-10-28 ENCOUNTER — Encounter (HOSPITAL_COMMUNITY): Payer: Self-pay

## 2024-10-28 ENCOUNTER — Inpatient Hospital Stay (HOSPITAL_COMMUNITY)
Admission: EM | Admit: 2024-10-28 | Discharge: 2024-10-31 | Disposition: A | Discharge status: Home / Self Care | Attending: Internal Medicine | Admitting: Internal Medicine

## 2024-10-28 DIAGNOSIS — E8770 Fluid overload, unspecified: Secondary | ICD-10-CM | POA: Diagnosis present

## 2024-10-28 DIAGNOSIS — I2722 Pulmonary hypertension due to left heart disease: Secondary | ICD-10-CM | POA: Diagnosis present

## 2024-10-28 DIAGNOSIS — I10 Essential (primary) hypertension: Secondary | ICD-10-CM | POA: Diagnosis present

## 2024-10-28 DIAGNOSIS — I4589 Other specified conduction disorders: Secondary | ICD-10-CM | POA: Diagnosis present

## 2024-10-28 DIAGNOSIS — R002 Palpitations: Secondary | ICD-10-CM | POA: Diagnosis present

## 2024-10-28 DIAGNOSIS — Z87891 Personal history of nicotine dependence: Secondary | ICD-10-CM | POA: Diagnosis not present

## 2024-10-28 DIAGNOSIS — I4892 Unspecified atrial flutter: Principal | ICD-10-CM | POA: Diagnosis present

## 2024-10-28 DIAGNOSIS — Z7901 Long term (current) use of anticoagulants: Secondary | ICD-10-CM

## 2024-10-28 DIAGNOSIS — Z8249 Family history of ischemic heart disease and other diseases of the circulatory system: Secondary | ICD-10-CM

## 2024-10-28 DIAGNOSIS — I959 Hypotension, unspecified: Secondary | ICD-10-CM | POA: Diagnosis present

## 2024-10-28 DIAGNOSIS — Z823 Family history of stroke: Secondary | ICD-10-CM

## 2024-10-28 DIAGNOSIS — I071 Rheumatic tricuspid insufficiency: Secondary | ICD-10-CM | POA: Diagnosis present

## 2024-10-28 DIAGNOSIS — Z833 Family history of diabetes mellitus: Secondary | ICD-10-CM

## 2024-10-28 DIAGNOSIS — Z5982 Transportation insecurity: Secondary | ICD-10-CM

## 2024-10-28 DIAGNOSIS — R000 Tachycardia, unspecified: Secondary | ICD-10-CM | POA: Diagnosis present

## 2024-10-28 DIAGNOSIS — I2723 Pulmonary hypertension due to lung diseases and hypoxia: Secondary | ICD-10-CM | POA: Diagnosis present

## 2024-10-28 DIAGNOSIS — F17210 Nicotine dependence, cigarettes, uncomplicated: Secondary | ICD-10-CM | POA: Diagnosis present

## 2024-10-28 DIAGNOSIS — I2510 Atherosclerotic heart disease of native coronary artery without angina pectoris: Secondary | ICD-10-CM | POA: Diagnosis not present

## 2024-10-28 DIAGNOSIS — F419 Anxiety disorder, unspecified: Secondary | ICD-10-CM | POA: Diagnosis not present

## 2024-10-28 DIAGNOSIS — I482 Chronic atrial fibrillation, unspecified: Secondary | ICD-10-CM | POA: Diagnosis present

## 2024-10-28 DIAGNOSIS — E782 Mixed hyperlipidemia: Secondary | ICD-10-CM

## 2024-10-28 DIAGNOSIS — Z803 Family history of malignant neoplasm of breast: Secondary | ICD-10-CM | POA: Diagnosis not present

## 2024-10-28 DIAGNOSIS — E7800 Pure hypercholesterolemia, unspecified: Secondary | ICD-10-CM | POA: Diagnosis present

## 2024-10-28 DIAGNOSIS — J439 Emphysema, unspecified: Secondary | ICD-10-CM | POA: Diagnosis present

## 2024-10-28 DIAGNOSIS — I361 Nonrheumatic tricuspid (valve) insufficiency: Secondary | ICD-10-CM | POA: Diagnosis not present

## 2024-10-28 DIAGNOSIS — I272 Pulmonary hypertension, unspecified: Secondary | ICD-10-CM | POA: Diagnosis not present

## 2024-10-28 DIAGNOSIS — Z79899 Other long term (current) drug therapy: Secondary | ICD-10-CM

## 2024-10-28 DIAGNOSIS — I50812 Chronic right heart failure: Secondary | ICD-10-CM | POA: Diagnosis not present

## 2024-10-28 DIAGNOSIS — I4891 Unspecified atrial fibrillation: Secondary | ICD-10-CM | POA: Diagnosis not present

## 2024-10-28 LAB — BASIC METABOLIC PANEL WITH GFR
Anion gap: 8 (ref 5–15)
BUN: 21 mg/dL (ref 8–23)
CO2: 31 mmol/L (ref 22–32)
Calcium: 9.5 mg/dL (ref 8.9–10.3)
Chloride: 101 mmol/L (ref 98–111)
Creatinine, Ser: 0.9 mg/dL (ref 0.44–1.00)
GFR, Estimated: >60 mL/min
Glucose, Bld: 82 mg/dL (ref 70–99)
Potassium: 4.7 mmol/L (ref 3.5–5.1)
Sodium: 140 mmol/L (ref 135–145)

## 2024-10-28 LAB — CBC
HCT: 50 % — ABNORMAL HIGH (ref 36.0–46.0)
Hemoglobin: 15.7 g/dL — ABNORMAL HIGH (ref 12.0–15.0)
MCH: 31.1 pg (ref 26.0–34.0)
MCHC: 31.4 g/dL (ref 30.0–36.0)
MCV: 99 fL (ref 80.0–100.0)
Platelets: 278 K/uL (ref 150–400)
RBC: 5.05 MIL/uL (ref 3.87–5.11)
RDW: 13.1 % (ref 11.5–15.5)
WBC: 10.6 K/uL — ABNORMAL HIGH (ref 4.0–10.5)
nRBC: 0 % (ref 0.0–0.2)

## 2024-10-28 LAB — I-STAT CHEM 8, ED
BUN: 23 mg/dL (ref 8–23)
Calcium, Ion: 1.17 mmol/L (ref 1.15–1.40)
Chloride: 99 mmol/L (ref 98–111)
Creatinine, Ser: 1 mg/dL (ref 0.44–1.00)
Glucose, Bld: 86 mg/dL (ref 70–99)
HCT: 51 % — ABNORMAL HIGH (ref 36.0–46.0)
Hemoglobin: 17.3 g/dL — ABNORMAL HIGH (ref 12.0–15.0)
Potassium: 4.3 mmol/L (ref 3.5–5.1)
Sodium: 141 mmol/L (ref 135–145)
TCO2: 31 mmol/L (ref 22–32)

## 2024-10-28 LAB — MAGNESIUM: Magnesium: 1.7 mg/dL (ref 1.7–2.4)

## 2024-10-28 LAB — TSH: TSH: 1.13 u[IU]/mL (ref 0.350–4.500)

## 2024-10-28 MED ORDER — SODIUM CHLORIDE 0.9 % IV BOLUS: 500.0000 mL | Freq: Once | INTRAVENOUS | Status: DC

## 2024-10-28 MED ORDER — SODIUM CHLORIDE 0.9 % IV BOLUS
500.0000 mL | Freq: Once | INTRAVENOUS | Status: AC
  Administered 2024-10-28: 500 mL via INTRAVENOUS

## 2024-10-28 MED ORDER — APIXABAN 5 MG PO TABS
10.0000 mg | ORAL_TABLET | Freq: Once | ORAL | Status: AC
  Administered 2024-10-28: 10 mg via ORAL
  Filled 2024-10-28: qty 2

## 2024-10-28 MED ORDER — DILTIAZEM HCL-DEXTROSE 125-5 MG/125ML-% IV SOLN (PREMIX): 5.0000 mg/h | INTRAVENOUS | Status: DC

## 2024-10-28 MED ORDER — METOPROLOL TARTRATE 5 MG/5ML IV SOLN
5.0000 mg | Freq: Once | INTRAVENOUS | Status: DC
  Filled 2024-10-28: qty 5

## 2024-10-28 MED ORDER — ACETAMINOPHEN 325 MG PO TABS: 650.0000 mg | ORAL_TABLET | ORAL | Status: DC | PRN

## 2024-10-28 MED ORDER — ONDANSETRON HCL 4 MG/2ML IJ SOLN: 4.0000 mg | Freq: Four times a day (QID) | INTRAMUSCULAR | Status: DC | PRN

## 2024-10-28 MED ORDER — METOPROLOL TARTRATE 25 MG PO TABS
12.5000 mg | ORAL_TABLET | Freq: Two times a day (BID) | ORAL | Status: DC
  Administered 2024-10-28 – 2024-10-29 (×2): 12.5 mg via ORAL
  Filled 2024-10-28: qty 0.5
  Filled 2024-10-28 (×2): qty 1
  Filled 2024-10-28: qty 0.5

## 2024-10-28 NOTE — Progress Notes (Signed)
" °  °  BP has remained hypotensive - will not support diltiazem . Short acting IV metoprolol  will wear off. Start low dose metoprolol  tartrate 12.5 mg BID - hold for SBP <90.  Keep NPO p MN for TEE/DCCV tomorrow.  Vinie KYM Maxcy, MD, [facility], FNLA, FACP  [place]  [facility]  Medical Director of the Advanced Lipid Disorders &  Cardiovascular Risk Reduction Clinic Diplomate of the American Board of Clinical Lipidology Attending Cardiologist  Direct Dial: [phone]  Fax: [phone]  Website:  [email or web address]  "

## 2024-10-28 NOTE — ED Triage Notes (Signed)
 Pt to er via ems, per ems pt is here for A flutter, states that she was seen at her cards doc for the same yesterday and today, states that she is here for a flutter.  States that they placed and iv in her L ac.  Pt states that she has no pain and can't feel her heart racing, skin warm and dry.

## 2024-10-28 NOTE — ED Notes (Signed)
 On-call Card Fellow contacted for sustained rapid flutter, no new orders at this time.

## 2024-10-28 NOTE — ED Provider Notes (Signed)
 " [place] EMERGENCY DEPARTMENT AT [facility] Provider Note   CSN: [id number] Arrival date & time: 10/28/24  1311     Patient presents with: Tachycardia   Brittany Gomez is a 73 y.o. female.   HPI 73 year old female presents today complaining of palpitations.  She states that these began on Friday.  She was seen outpatient primary care yesterday.  She states at that time she was started on metoprolol .  She took 2 doses of metoprolol  50 mg yesterday and 1 dose today.  She continues to feel palpitations and fluttering in her chest.  She states that she does not have any chest pain she has some mild dyspnea.  She has been a smoker but states that she quit a week ago.  She presents today via EMS with tachyarrhythmia.     Prior to Admission medications  Medication Sig Start Date End Date Taking? Authorizing Provider  acetaminophen  (TYLENOL ) 325 MG tablet 1 tablet as needed    [provider]  albuterol  (VENTOLIN  HFA) 108 (90 Base) MCG/ACT inhaler Inhale 2 puffs into the lungs every 6 (six) hours as needed for wheezing or shortness of breath. 07/21/19   Melonie Colonel, Mikel HERO, MD  amLODipine (NORVASC) 10 MG tablet Take 1 tablet by mouth daily. 07/21/20   [provider]  pravastatin  (PRAVACHOL ) 40 MG tablet TAKE 1 TABLET(40 MG) BY MOUTH DAILY 05/22/22   Kip Ade, NP  zolpidem  (AMBIEN ) 5 MG tablet Take 1 tablet (5 mg total) by mouth at bedtime as needed for sleep. 12/14/20   Kip Ade, NP    Allergies: Meloxicam     Review of Systems  Updated Vital Signs BP 92/70 (BP Location: Left Arm)   Pulse (!) 115   Temp 98 F (36.7 C) (Oral)   Resp 18   Ht 1.651 m (5' 5)   Wt 56.7 kg   LMP 11/06/1998   SpO2 94%   BMI 20.80 kg/m   Physical Exam Vitals reviewed.  HENT:     Head: Normocephalic.     Right Ear: External ear normal.     Left Ear: External ear normal.     Nose: Nose normal.     Mouth/Throat:     Pharynx: Oropharynx is clear.  Eyes:      Extraocular Movements: Extraocular movements intact.     Pupils: Pupils are equal, round, and reactive to light.  Cardiovascular:     Rate and Rhythm: Regular rhythm. Tachycardia present.     Pulses: Normal pulses.  Pulmonary:     Effort: Pulmonary effort is normal.  Abdominal:     General: Abdomen is flat.     Palpations: Abdomen is soft.  Musculoskeletal:        General: Normal range of motion.     Cervical back: Normal range of motion.  Skin:    General: Skin is warm.     Capillary Refill: Capillary refill takes less than 2 seconds.  Neurological:     General: No focal deficit present.     Mental Status: She is alert.  Psychiatric:        Mood and Affect: Mood normal.     (all labs ordered are listed, but only abnormal results are displayed) Labs Reviewed  CBC - Abnormal; Notable for the following components:      Result Value   WBC 10.6 (*)    Hemoglobin 15.7 (*)    HCT 50.0 (*)    All other components within normal limits  I-STAT CHEM 8, ED - Abnormal; Notable for the following components:   Hemoglobin 17.3 (*)    HCT 51.0 (*)    All other components within normal limits  BASIC METABOLIC PANEL WITH GFR    EKG: EKG Interpretation Date/Time:  Tuesday October 28 2024 13:17:43 EST Ventricular Rate:  118 PR Interval:  182 QRS Duration:  189 QT Interval:  400 QTC Calculation: 519 R Axis:   154  Text Interpretation: Atrial flutter Confirmed by Levander Houston [phone]) on 10/28/2024 2:59:04 PM  Radiology: No results found.   Procedures   Medications Ordered in the ED - No data to display  Clinical Course as of 10/28/24 1504  Tue Oct 28, 2024  1457 Chest x-Aala Ransom reviewed interpreted and is abnormality noted radiologist interpretation is pending [DR]    Clinical Course User Index [DR] Levander Houston, MD                                 Medical Decision Making Amount and/or Complexity of Data Reviewed Labs: ordered. Radiology: ordered.   73 year old  female presents today with palpitations that began on Friday.  Here in the ED heart rate was up to 140.  Patient's blood pressure initially 110/70.  She is not having any chest pain or dyspnea.  EKG is reviewed and appears to be atrial flutter. Patient is not a candidate for ED cardioversion due to length of symptoms and was started over 4 days ago. Labs obtained to evaluate for metabolic abnormalities that would contribute.  Patient's sodium potassium chloride and calcium  were within normal limits. CBC reviewed and within normal limits  Care discussed with cardiology who will see for admission     Final diagnoses:  New onset atrial flutter [facility])    ED Discharge Orders     None          Levander Houston, MD 10/28/24 1504  "

## 2024-10-28 NOTE — ED Provider Notes (Signed)
" °  [place] EMERGENCY DEPARTMENT AT [facility] Provider Assume Care Note I assumed care of Brittany Gomez on 10/28/2024 at 3 PM from Dr. Levander.   Briefly, Brittany Gomez is a 73 y.o. female who: PMHx: Anxiety, hypertension, prior STI P/w 4 days of palpitations, recently started on metoprolol , found to be in A-fib RVR  Plan at the time of handoff: Cardiology consulted, awaiting cardiology mission   Please refer to the original providers note for additional information regarding the care of Brittany Gomez.  Reassessment: I personally reassessed the patient: Patient resting comfortably in bed  Vital Signs:  ED Triage Vitals  Encounter Vitals Group     BP 10/28/24 1317 92/70     Girls Systolic BP Percentile --      Girls Diastolic BP Percentile --      Boys Systolic BP Percentile --      Boys Diastolic BP Percentile --      Pulse Rate 10/28/24 1317 (!) 115     Resp 10/28/24 1317 18     Temp 10/28/24 1317 98 F (36.7 C)     Temp Source 10/28/24 1317 Oral     SpO2 10/28/24 1317 94 %     Weight 10/28/24 1320 125 lb (56.7 kg)     Height 10/28/24 1320 5' 5 (1.651 m)     Head Circumference --      Peak Flow --      Pain Score 10/28/24 1320 0     Pain Loc --      Pain Education --      Exclude from Growth Chart --      Hemodynamics:  The patient is hemodynamically stable. Mental Status:  The patient is alert  Additional MDM: Awaiting cardiology admission, patient had recurrent elevation of her heart rate to the 140s, therefore was given low dose of metoprolol  given patient had low normotension small fluid bolus was given for blood pressure.  Cardiology evaluated the patient and ultimately placed orders.  No additional acute events while patient was under my care.  Disposition: ADMIT: I believe the patient requires admission for further care and management. The patient was admitted to cardiology. Please see inpatient provider note for additional  treatment plan details.    FREDRIK CANDIE Later, MD Emergency Medicine    Later Brittany RAMAN, MD 10/28/24 1944  "

## 2024-10-28 NOTE — H&P (Signed)
 "  Cardiology Admission History and Physical   Patient ID: Brittany Gomez MRN: [id number]; DOB: Aug 16, 1951   Admission date: 10/28/2024  PCP:  Arloa Elsie SAUNDERS, MD   [facility] Providers Cardiologist:  None       Chief Complaint: Palpitations  Patient Profile: Brittany Gomez is a 73 y.o. female with past medical history of primary hypertension, hypercholesterolemia, and tobacco use who is being seen 10/28/2024 for the evaluation of palpitations.  History of Present Illness: [name] has past medical history as stated above. She presented to [facility] ED this afternoon complaining of intermittent palpitations for the last 4 days (since Friday, 10/24/24). She was first aware of palpitations while asleep and woken up by a high heart rate alert on her smart watch. She was seen at [facility] urgent care yesterday afternoon at which time EKG showed sinus tachycardia with ventricular rate of 146; she was then started on metoprolol  tartrate with instructions to return for follow-up the following day. She took one metoprolol  25 mg dose right after that visit, a second dose this morning at 3AM, and a third dose at approximately 10AM today. However, she did not feel any relief or improvement on metoprolol . She returned to [facility] urgent care this morning as instructed, at which time EKG showed atrial flutter and was sent to the ED by ambulance. On presentation at [facility] ED, she was found to be in atrial flutter with rate in the 140s and hypotensive in the 90s/70s.   Upon speaking with patient today, she does not feel any palpitations at this time. Denies any history of palpitations or atrial flutter/fibrillation. Denies CP, SOB, lightheadedness, dizziness, syncope. Admits that she has not been eating or drinking as much as usual due to problems with her dentures.  ED workup: EKG showed atrial flutter with ventricular rate at 118 bpm. CBC suggestive of  hemoconcentration with Hgb 15.7 and HCT 50%. BMP unremarkable. No pleural effusion on CXR.    Past Medical History:  Diagnosis Date   Allergy    Anxiety    Arthritis    Hypertension    Plantar fasciitis    STD (sexually transmitted disease)    treated for chlamydia in her 20's   Urinary incontinence    Past Surgical History:  Procedure Laterality Date   CESAREAN SECTION       Medications Prior to Admission: Prior to Admission medications  Medication Sig Start Date End Date Taking? Authorizing Provider  acetaminophen  (TYLENOL ) 325 MG tablet 1 tablet as needed    [provider]  albuterol  (VENTOLIN  HFA) 108 (90 Base) MCG/ACT inhaler Inhale 2 puffs into the lungs every 6 (six) hours as needed for wheezing or shortness of breath. 07/21/19   Melonie Colonel, Mikel HERO, MD  amLODipine (NORVASC) 10 MG tablet Take 1 tablet by mouth daily. 07/21/20   [provider]  pravastatin  (PRAVACHOL ) 40 MG tablet TAKE 1 TABLET(40 MG) BY MOUTH DAILY 05/22/22   Kip Ade, NP  zolpidem  (AMBIEN ) 5 MG tablet Take 1 tablet (5 mg total) by mouth at bedtime as needed for sleep. 12/14/20   Kip Ade, NP     Allergies:   Allergies[1]  Social History:   Social History   Socioeconomic History   Marital status: Married    Spouse name: Not on file   Number of children: Not on file   Years of education: Not on file   Highest education level: Not on file  Occupational History   Not on file  Tobacco Use   Smoking status: Former    Current packs/day: 0.75    Average packs/day: 0.8 packs/day for 50.0 years (37.5 ttl pk-yrs)    Types: Cigarettes   Smokeless tobacco: Never  Vaping Use   Vaping status: Never Used  Substance and Sexual Activity   Alcohol use: No   Drug use: No   Sexual activity: Not Currently    Birth control/protection: Post-menopausal  Other Topics Concern   Not on file  Social History Narrative   Not on file   Social Drivers of Health   Tobacco Use: Medium  Risk (10/28/2024)   Patient History    Smoking Tobacco Use: Former    Smokeless Tobacco Use: Never    Passive Exposure: Not on [place] Strain: Not on file  Food Insecurity: Not on file  Transportation Needs: Not on file  Physical Activity: Not on file  Stress: Not on file  Social Connections: Not on file  Intimate Partner Violence: Not on file  Depression (EYV7-0): Not on file  Alcohol Screen: Not on file  Housing: Not on file  Utilities: Not on file  Health Literacy: Not on file     Family History:   The patient's family history includes Breast cancer in her sister; Cancer in her sister; Diabetes in her mother and sister; Hyperlipidemia in her sister; Hypertension in her brother, sister, and sister; Stroke in her father.    ROS:  Please see the history of present illness.  All other ROS reviewed and negative.     Physical Exam/Data: Vitals:   10/28/24 1656 10/28/24 1745 10/28/24 1800 10/28/24 1812  BP: (!) 119/108 104/82 98/80   Pulse: (!) 143 (!) 129 (!) 144   Resp: (!) 24 16 (!) 23   Temp:    (!) 97.5 F (36.4 C)  TempSrc:    Oral  SpO2: 93% 95% 95%   Weight:      Height:       No intake or output data in the 24 hours ending 10/28/24 1813    10/28/2024    1:20 PM 12/14/2020    2:43 PM 11/01/2020    2:56 PM  Last 3 Weights  Weight (lbs) 125 lb 157 lb 6.4 oz 158 lb  Weight (kg) 56.7 kg 71.396 kg 71.668 kg     Body mass index is 20.8 kg/m.  General: In no acute distress, resting and conversing comfortably in bed on room air Vascular: Distal pulses 2+ bilaterally   Cardiac: Tachycardic with regular rhythm, no murmur Lungs: Clear to auscultation bilaterally; no wheezing, rhonchi, or rales  Abd: Soft, nontender Ext: No peripheral edema Musculoskeletal:  No deformities Skin: Warm and dry  Psych: Normal affect   EKG/Telemetry: The EKG/telemetry in the ED was personally reviewed and demonstrates atrial flutter with ventricular rate in the  120-140s.  Relevant CV Studies:  CXR (10/28/24): COPD with mild left basilar linear scarring and/or atelectasis. Heart size and mediastinal contours within normal limits. No pleural effusion noted.   Low-dose CT chest (10/15/2024):  Lung-RADS 2, benign appearance or behavior. Continue annual screening with low-dose chest CT without contrast in 12 months. Three-vessel coronary atherosclerosis. Aortic Atherosclerosis (ICD10-I70.0) and Emphysema (ICD10-J43.9).   Laboratory Data: High Sensitivity Troponin:  No results for input(s): TROPONINIHS in the last 720 hours. No results for input(s): TRNPT in the last 720 hours.      Chemistry Recent Labs  Lab 10/28/24 1324 10/28/24 1434  NA 140 141  K 4.7 4.3  CL 101 99  CO2 31  --   GLUCOSE 82 86  BUN 21 23  CREATININE 0.90 1.00  CALCIUM  9.5  --   GFRNONAA >60  --   ANIONGAP 8  --     No results for input(s): PROT, ALBUMIN, AST, ALT, ALKPHOS, BILITOT in the last 168 hours. Lipids No results for input(s): CHOL, TRIG, HDL, LABVLDL, LDLCALC, CHOLHDL in the last 168 hours. Hematology Recent Labs  Lab 10/28/24 1324 10/28/24 1434  WBC 10.6*  --   RBC 5.05  --   HGB 15.7* 17.3*  HCT 50.0* 51.0*  MCV 99.0  --   MCH 31.1  --   MCHC 31.4  --   RDW 13.1  --   PLT 278  --    Thyroid  No results for input(s): TSH, FREET4 in the last 168 hours. BNPNo results for input(s): BNP, PROBNP in the last 168 hours.  DDimer No results for input(s): DDIMER in the last 168 hours.  Radiology/Studies:  DG Chest Port 1 View Result Date: 10/28/2024 CLINICAL DATA:  Tachycardia. EXAM: PORTABLE CHEST 1 VIEW COMPARISON:  December 13, 2021 FINDINGS: The heart size and mediastinal contours are within normal limits. There is mild calcification of the aortic arch. The lungs are hyperinflated with evidence of emphysematous lung disease with mild linear scarring and/or atelectasis is seen within the retrocardiac region of the  left lung base. No pleural effusion or pneumothorax is identified. Multilevel degenerative changes are seen throughout the thoracic spine. IMPRESSION: COPD with mild left basilar linear scarring and/or atelectasis. Electronically Signed   By: Suzen Dials M.D.   On: 10/28/2024 15:33    Assessment and Plan: Atrial flutter CBC with Hgb 17.3 and HCT 51%, suspicion for dehydration as potential precipitant Exact duration in atrial flutter unknown, presumably around 4 days (since Friday, 08/24/24) Given that patient is beyond 48-hour window, will schedule for TEE-guided cardioversion tomorrow Obtain remaining atrial flutter workup: TSH Will start patient on diltiazem  IV and Eliquis  10 mg tonight then 5 mg BID thereafter, per TEE-guided cardioversion protocol NPO at midnight Informed Consent   Shared Decision Making/Informed Consent   The risks [stroke, cardiac arrhythmias rarely resulting in the need for a temporary or permanent pacemaker, skin irritation or burns, esophageal damage, perforation (1:10,000 risk), bleeding, pharyngeal hematoma as well as other potential complications associated with conscious sedation including aspiration, arrhythmia, respiratory failure and death], benefits (treatment guidance, restoration of normal sinus rhythm, diagnostic support) and alternatives of a transesophageal echocardiogram guided cardioversion were discussed in detail with Ms. Santarelli and she is willing to proceed.    Hypertension Hypotensive in the 90s/70s on presentation in the ED >> most recently 119/108 Home-med amlodipine has been held by patient since yesterday due to [facility] urgent care starting metoprolol  tartrate PTA had taken metoprolol  tartrate 25 mg x 3 doses without any relief/improvement   Tobacco use 38.5 pack years, pt reports she quit a week ago Encouraged tobacco cessation   Risk Assessment/Risk Scores:        CHA2DS2-VASc Score = 3   This indicates a 3.2% annual  risk of stroke. The patient's score is based upon: CHF History: 0 HTN History: 1 Diabetes History: 0 Stroke History: 0 Vascular Disease History: 0 Age Score: 1 Gender Score: 1     Code Status: Full Code  Severity of Illness: The appropriate patient status for this patient is INPATIENT. Inpatient status is judged to be reasonable  and necessary in order to provide the required intensity of service to ensure the patient's safety. The patient's presenting symptoms, physical exam findings, and initial radiographic and laboratory data in the context of their chronic comorbidities is felt to place them at high risk for further clinical deterioration. Furthermore, it is not anticipated that the patient will be medically stable for discharge from the hospital within 2 midnights of admission.   * I certify that at the point of admission it is my clinical judgment that the patient will require inpatient hospital care spanning beyond 2 midnights from the point of admission due to high intensity of service, high risk for further deterioration and high frequency of surveillance required.*  For questions or updates, please contact [facility] Please consult [email or web address] for contact info under       Signed, Owen MARLA Daniels, PA-C  10/28/2024 6:13 PM       [1]  Allergies Allergen Reactions   Meloxicam  Other (See Comments)   "

## 2024-10-29 ENCOUNTER — Inpatient Hospital Stay (HOSPITAL_COMMUNITY)

## 2024-10-29 ENCOUNTER — Inpatient Hospital Stay (HOSPITAL_COMMUNITY): Admitting: Anesthesiology

## 2024-10-29 ENCOUNTER — Encounter (HOSPITAL_COMMUNITY): Payer: Self-pay | Admitting: Internal Medicine

## 2024-10-29 ENCOUNTER — Other Ambulatory Visit (HOSPITAL_COMMUNITY): Payer: Self-pay

## 2024-10-29 ENCOUNTER — Telehealth (HOSPITAL_COMMUNITY): Payer: Self-pay | Admitting: Pharmacy Technician

## 2024-10-29 ENCOUNTER — Encounter (HOSPITAL_COMMUNITY): Admission: EM | Disposition: A | Payer: Self-pay | Source: Home / Self Care | Attending: Internal Medicine

## 2024-10-29 DIAGNOSIS — I361 Nonrheumatic tricuspid (valve) insufficiency: Secondary | ICD-10-CM

## 2024-10-29 DIAGNOSIS — I4891 Unspecified atrial fibrillation: Secondary | ICD-10-CM

## 2024-10-29 DIAGNOSIS — F419 Anxiety disorder, unspecified: Secondary | ICD-10-CM

## 2024-10-29 DIAGNOSIS — I10 Essential (primary) hypertension: Secondary | ICD-10-CM

## 2024-10-29 DIAGNOSIS — I4892 Unspecified atrial flutter: Secondary | ICD-10-CM | POA: Diagnosis not present

## 2024-10-29 DIAGNOSIS — Z87891 Personal history of nicotine dependence: Secondary | ICD-10-CM

## 2024-10-29 HISTORY — PX: CARDIOVERSION: EP1203

## 2024-10-29 HISTORY — PX: TRANSESOPHAGEAL ECHOCARDIOGRAM (CATH LAB): EP1270

## 2024-10-29 LAB — BASIC METABOLIC PANEL WITH GFR
Anion gap: 11 (ref 5–15)
BUN: 21 mg/dL (ref 8–23)
CO2: 27 mmol/L (ref 22–32)
Calcium: 9.1 mg/dL (ref 8.9–10.3)
Chloride: 103 mmol/L (ref 98–111)
Creatinine, Ser: 0.83 mg/dL (ref 0.44–1.00)
GFR, Estimated: >60 mL/min
Glucose, Bld: 59 mg/dL — ABNORMAL LOW (ref 70–99)
Potassium: 4.8 mmol/L (ref 3.5–5.1)
Sodium: 140 mmol/L (ref 135–145)

## 2024-10-29 SURGERY — CARDIOVERSION (CATH LAB)
Anesthesia: General

## 2024-10-29 SURGERY — TRANSESOPHAGEAL ECHOCARDIOGRAM (TEE) (CATHLAB)
Anesthesia: Monitor Anesthesia Care

## 2024-10-29 MED ORDER — PROPOFOL 500 MG/50ML IV EMUL
INTRAVENOUS | Status: DC | PRN
  Administered 2024-10-29: 80 ug/kg/min via INTRAVENOUS

## 2024-10-29 MED ORDER — SODIUM CHLORIDE 0.9 % IV SOLN: INTRAVENOUS | Status: DC

## 2024-10-29 MED ORDER — LIDOCAINE 2% (20 MG/ML) 5 ML SYRINGE
INTRAMUSCULAR | Status: DC | PRN
  Administered 2024-10-29: 60 mg via INTRAVENOUS

## 2024-10-29 MED ORDER — APIXABAN 5 MG PO TABS
5.0000 mg | ORAL_TABLET | Freq: Two times a day (BID) | ORAL | Status: DC
  Administered 2024-10-29 – 2024-10-31 (×4): 5 mg via ORAL
  Filled 2024-10-29 (×4): qty 1

## 2024-10-29 MED ORDER — PRAVASTATIN SODIUM 40 MG PO TABS: 40.0000 mg | ORAL_TABLET | Freq: Every day | ORAL | Status: DC

## 2024-10-29 MED ORDER — PHENYLEPHRINE 80 MCG/ML (10ML) SYRINGE FOR IV PUSH (FOR BLOOD PRESSURE SUPPORT)
PREFILLED_SYRINGE | INTRAVENOUS | Status: DC | PRN
  Administered 2024-10-29 (×2): 120 ug via INTRAVENOUS

## 2024-10-29 MED ORDER — APIXABAN 5 MG PO TABS
ORAL_TABLET | ORAL | Status: AC
  Administered 2024-10-29: 5 mg via ORAL
  Filled 2024-10-29: qty 1

## 2024-10-29 MED ORDER — PROPOFOL 10 MG/ML IV BOLUS
INTRAVENOUS | Status: DC | PRN
  Administered 2024-10-29 (×2): 20 mg via INTRAVENOUS
  Administered 2024-10-29: 10 mg via INTRAVENOUS
  Administered 2024-10-29: 20 mg via INTRAVENOUS
  Administered 2024-10-29: 50 mg via INTRAVENOUS

## 2024-10-29 MED ORDER — PROPOFOL 10 MG/ML IV BOLUS
INTRAVENOUS | Status: DC | PRN
  Administered 2024-10-29: 50 mg via INTRAVENOUS

## 2024-10-29 SURGICAL SUPPLY — 1 items: PAD DEFIB RADIO PHYSIO CONN (PAD) ×1 IMPLANT

## 2024-10-29 NOTE — Transfer of Care (Signed)
 Immediate Anesthesia Transfer of Care Note  Patient: Brittany Gomez  Procedure(s) Performed: CARDIOVERSION  Patient Location: PACU  Anesthesia Type:General  Level of Consciousness: awake, alert , and oriented  Airway & Oxygen Therapy: Patient Spontanous Breathing and Patient connected to nasal cannula oxygen  Post-op Assessment: Report given to RN and Post -op Vital signs reviewed and stable  Post vital signs: Reviewed and stable  Last Vitals:  Vitals Value Taken Time  BP 96/81 10/29/24 10:24  Temp    Pulse 83 10/29/24 10:24  Resp 28 10/29/24 10:24  SpO2 99 % 10/29/24 10:24  Vitals shown include unfiled device data.  Last Pain:  Vitals:   10/29/24 0713  TempSrc: Temporal  PainSc:          Complications: No notable events documented.

## 2024-10-29 NOTE — Anesthesia Postprocedure Evaluation (Signed)
"   Anesthesia Post Note  Patient: Brittany Gomez  Procedure(s) Performed: CARDIOVERSION     Patient location during evaluation: Cath Lab Anesthesia Type: General Level of consciousness: awake and alert Pain management: pain level controlled Vital Signs Assessment: post-procedure vital signs reviewed and stable Respiratory status: spontaneous breathing, nonlabored ventilation and respiratory function stable Cardiovascular status: blood pressure returned to baseline and stable Postop Assessment: no apparent nausea or vomiting Anesthetic complications: no   No notable events documented.  Last Vitals:  Vitals:   10/29/24 1023 10/29/24 1024  BP:  96/81  Pulse: 87 87  Resp: (!) 25 (!) 25  Temp:    SpO2: 98% 98%    Last Pain:  Vitals:   10/29/24 0713  TempSrc: Temporal  PainSc:                  [name],W. EDMOND      "

## 2024-10-29 NOTE — Transfer of Care (Signed)
 Immediate Anesthesia Transfer of Care Note  Patient: Brittany Gomez  Procedure(s) Performed: TRANSESOPHAGEAL ECHOCARDIOGRAM  Patient Location: PACU  Anesthesia Type:MAC  Level of Consciousness: awake, alert , and oriented  Airway & Oxygen Therapy: Patient Spontanous Breathing and Patient connected to nasal cannula oxygen  Post-op Assessment: Report given to RN and Post -op Vital signs reviewed and stable  Post vital signs: Reviewed and stable  Last Vitals:  Vitals Value Taken Time  BP    Temp    Pulse 144 10/29/24 09:02  Resp 19 10/29/24 09:02  SpO2 100 % 10/29/24 09:02  Vitals shown include unfiled device data.  Last Pain:  Vitals:   10/29/24 0713  TempSrc: Temporal  PainSc:          Complications: No notable events documented.

## 2024-10-29 NOTE — Progress Notes (Signed)
 "   DAILY PROGRESS NOTE   Patient Name: Brittany Gomez Date of Encounter: 10/29/2024 Cardiologist: None  Chief Complaint   Tired  Patient Profile   Brittany Gomez is a 73 y.o. female with past medical history of primary hypertension, hypercholesterolemia, and tobacco use who is being seen 10/28/2024 for the evaluation of palpitations.   Subjective   Seen and evaluated immediately post-TEE. Remains in 2:1 flutter- cardioversion not performed due to possible LAA thrombus.  I personally reviewed the images with Dr. Ren and Dr. Delford - there is a small, calcified appearing area of lateral wall thickening of the appendage toward the apex which is not mobile. Otherwise, the consensus was that no soft or mobile thrombus was present.  This would present very low risk of stroke.  Objective   Vitals:   10/29/24 0800 10/29/24 0910 10/29/24 0915 10/29/24 0920  BP: (!) 112/96  104/80   Pulse: (!) 152 (!) 148 (!) 147 (!) 150  Resp: (!) 25 (!) 26 18 (!) 21  Temp:      TempSrc:      SpO2: 96% 96% 94% 98%  Weight:      Height:       No intake or output data in the 24 hours ending 10/29/24 0946 Filed Weights   10/28/24 1320  Weight: 56.7 kg    Physical Exam   General appearance: alert and no distress Lungs: clear to auscultation bilaterally Heart: regular tachycardia Extremities: extremities normal, atraumatic, no cyanosis or edema Neurologic: Grossly normal  Inpatient Medications    Scheduled Meds:  [MAR Hold] apixaban   5 mg Oral BID   [MAR Hold] metoprolol  tartrate  5 mg Intravenous Once   [MAR Hold] metoprolol  tartrate  12.5 mg Oral BID   [MAR Hold] pravastatin   40 mg Oral Daily    Continuous Infusions:  sodium chloride  200 mL/hr at 10/29/24 0901    PRN Meds: [MAR Hold] acetaminophen , [MAR Hold] ondansetron  (ZOFRAN ) IV   Labs   Results for orders placed or performed during the hospital encounter of 10/28/24 (from the past 48 hours)  CBC     Status:  Abnormal   Collection Time: 10/28/24  1:24 PM  Result Value Ref Range   WBC 10.6 (H) 4.0 - 10.5 K/uL   RBC 5.05 3.87 - 5.11 MIL/uL   Hemoglobin 15.7 (H) 12.0 - 15.0 g/dL   HCT 49.9 (H) 63.9 - 53.9 %   MCV 99.0 80.0 - 100.0 fL   MCH 31.1 26.0 - 34.0 pg   MCHC 31.4 30.0 - 36.0 g/dL   RDW 86.8 88.4 - 84.4 %   Platelets 278 150 - 400 K/uL   nRBC 0.0 0.0 - 0.2 %    Comment: Performed at [facility], [address] [place], [place] [address]  Basic metabolic panel     Status: None   Collection Time: 10/28/24  1:24 PM  Result Value Ref Range   Sodium 140 135 - 145 mmol/L   Potassium 4.7 3.5 - 5.1 mmol/L   Chloride 101 98 - 111 mmol/L   CO2 31 22 - 32 mmol/L   Glucose, Bld 82 70 - 99 mg/dL    Comment: Glucose reference range applies only to samples taken after fasting for at least 8 hours.   BUN 21 8 - 23 mg/dL   Creatinine, Ser 9.09 0.44 - 1.00 mg/dL   Calcium  9.5 8.9 - 10.3 mg/dL   GFR, Estimated >39 >39 mL/min  Comment: (NOTE) Calculated using the CKD-EPI Creatinine Equation (2021)    Anion gap 8 5 - 15    Comment: Performed at [facility], [address] [place], [place] [address]  I-stat chem 8, ED (not at [facility], [facility] or [facility])     Status: Abnormal   Collection Time: 10/28/24  2:34 PM  Result Value Ref Range   Sodium 141 135 - 145 mmol/L   Potassium 4.3 3.5 - 5.1 mmol/L   Chloride 99 98 - 111 mmol/L   BUN 23 8 - 23 mg/dL   Creatinine, Ser 8.99 0.44 - 1.00 mg/dL   Glucose, Bld 86 70 - 99 mg/dL    Comment: Glucose reference range applies only to samples taken after fasting for at least 8 hours.   Calcium , Ion 1.17 1.15 - 1.40 mmol/L   TCO2 31 22 - 32 mmol/L   Hemoglobin 17.3 (H) 12.0 - 15.0 g/dL   HCT 48.9 (H) 63.9 - 53.9 %  TSH     Status: None   Collection Time: 10/28/24  6:55 PM  Result Value Ref Range   TSH 1.130 0.350 - 4.500 uIU/mL    Comment: Performed at [facility], [address], [place], [place] [address]  Magnesium     Status: None    Collection Time: 10/28/24  6:55 PM  Result Value Ref Range   Magnesium 1.7 1.7 - 2.4 mg/dL    Comment: Performed at [facility], [address], [place], [place] [address]  Basic metabolic panel     Status: Abnormal   Collection Time: 10/29/24  5:19 AM  Result Value Ref Range   Sodium 140 135 - 145 mmol/L   Potassium 4.8 3.5 - 5.1 mmol/L   Chloride 103 98 - 111 mmol/L   CO2 27 22 - 32 mmol/L   Glucose, Bld 59 (L) 70 - 99 mg/dL    Comment: Glucose reference range applies only to samples taken after fasting for at least 8 hours.   BUN 21 8 - 23 mg/dL   Creatinine, Ser 9.16 0.44 - 1.00 mg/dL   Calcium  9.1 8.9 - 10.3 mg/dL   GFR, Estimated >39 >39 mL/min    Comment: (NOTE) Calculated using the CKD-EPI Creatinine Equation (2021)    Anion gap 11 5 - 15    Comment: Performed at [facility], [address], [place], [place] [address]    ECG   N/A  Telemetry   Atrial flutter with 2:1 AVB - Personally Reviewed  Radiology    EP STUDY Result Date: 10/29/2024 See surgical note for result.  DG Chest Port 1 View Result Date: 10/28/2024 CLINICAL DATA:  Tachycardia. EXAM: PORTABLE CHEST 1 VIEW COMPARISON:  December 13, 2021 FINDINGS: The heart size and mediastinal contours are within normal limits. There is mild calcification of the aortic arch. The lungs are hyperinflated with evidence of emphysematous lung disease with mild linear scarring and/or atelectasis is seen within the retrocardiac region of the left lung base. No pleural effusion or pneumothorax is identified. Multilevel degenerative changes are seen throughout the thoracic spine. IMPRESSION: COPD with mild left basilar linear scarring and/or atelectasis. Electronically Signed   By: Suzen Dials M.D.   On: 10/28/2024 15:33    Cardiac Studies   TEE report pending  Assessment   Active Problems:   New onset atrial flutter (HCC)   Plan   Remains in poorly controlled atrial flutter with 2:1 conduction -  hypotensive, not  able to be rate-controlled. Appears to have mild LV dysfunction on TEE and dilated RV. After review of TEE with Drs. Azobou and Nishan, there is agreement that no high-risk mobile thrombus was noted, therefore, I feel the risk of procedural stroke is low if we proceed with DCCV. There are few other options. I explained this to the patient and my concerns about persistent atrial flutter and risk of worsening cardiomyopathy. She understands the low, but possible stroke risk and is agreeable to proceed with DCCV.    Time Spent Directly with Patient:  I have spent a total of 25 minutes with the patient reviewing hospital notes, telemetry, EKGs, labs and examining the patient as well as establishing an assessment and plan that was discussed personally with the patient.  > 50% of time was spent in direct patient care.  Length of Stay:  LOS: 1 day   Vinie KYM Maxcy, MD, [facility], FNLA, FACP  [place]  [facility]  Medical Director of the Advanced Lipid Disorders &  Cardiovascular Risk Reduction Clinic Diplomate of the American Board of Clinical Lipidology Attending Cardiologist  Direct Dial: [phone]  Fax: [phone]  Website:  [email or web address] Vinie JAYSON Maxcy 10/29/2024, 9:46 AM   "

## 2024-10-29 NOTE — Anesthesia Preprocedure Evaluation (Signed)
"                                    Anesthesia Evaluation  Patient identified by MRN, date of birth, ID band Patient awake    Reviewed: Allergy & Precautions, H&P , NPO status , Patient's Chart, lab work & pertinent test results  Airway Mallampati: I  TM Distance: >3 FB Neck ROM: Full    Dental no notable dental hx. (+) Edentulous Upper, Edentulous Lower, Dental Advisory Given   Pulmonary former smoker   Pulmonary exam normal breath sounds clear to auscultation       Cardiovascular hypertension, Pt. on medications + dysrhythmias Atrial Fibrillation  Rhythm:Irregular Rate:Tachycardia     Neuro/Psych   Anxiety     negative neurological ROS     GI/Hepatic negative GI ROS, Neg liver ROS,,,  Endo/Other  negative endocrine ROS    Renal/GU negative Renal ROS  negative genitourinary   Musculoskeletal  (+) Arthritis , Osteoarthritis,    Abdominal   Peds  Hematology negative hematology ROS (+)   Anesthesia Other Findings   Reproductive/Obstetrics negative OB ROS                              Anesthesia Physical Anesthesia Plan  ASA: 3  Anesthesia Plan: General   Post-op Pain Management: Minimal or no pain anticipated   Induction: Intravenous  PONV Risk Score and Plan: 3 and Propofol  infusion and Treatment may vary due to age or medical condition  Airway Management Planned: Natural Airway and Nasal Cannula  Additional Equipment:   Intra-op Plan:   Post-operative Plan:   Informed Consent: I have reviewed the patients History and Physical, chart, labs and discussed the procedure including the risks, benefits and alternatives for the proposed anesthesia with the patient or authorized representative who has indicated his/her understanding and acceptance.     Dental advisory given  Plan Discussed with: CRNA  Anesthesia Plan Comments:         Anesthesia Quick Evaluation  "

## 2024-10-29 NOTE — CV Procedure (Signed)
" ° °  DIRECT CURRENT CARDIOVERSION  NAME:  Brittany Gomez    MRN: [id number] DOB:  [birth year]    ADMIT DATE: 10/28/2024  Indication:  Symptomatic atrial flutter  Procedure Note:  The patient signed informed consent. Anesthesia was administered by Dr. Epifanio.  Adequate airway was maintained throughout and vital followed per protocol.  They were cardioverted x 1 with 200J of biphasic synchronized energy.  They converted to NSR.  There were no apparent complications.  The patient had normal neuro status and respiratory status post procedure with vitals stable as recorded elsewhere.    Follow up: They will continue on current medical therapy and follow up with cardiology as scheduled.  [name], MD, [facility]   [facility]  10:23 AM  "

## 2024-10-29 NOTE — Interval H&P Note (Signed)
 History and Physical Interval Note:  10/29/2024 10:23 AM  Brittany Gomez  has presented today for surgery, with the diagnosis of atrial fibrillation.  The various methods of treatment have been discussed with the patient and family. After consideration of risks, benefits and other options for treatment, the patient has consented to  Procedures: CARDIOVERSION (N/A) as a surgical intervention.  The patient's history has been reviewed, patient examined, no change in status, stable for surgery.  I have reviewed the patient's chart and labs.  Questions were answered to the patient's satisfaction.     Taevyn Hausen [name]

## 2024-10-29 NOTE — Interval H&P Note (Signed)
 History and Physical Interval Note:  10/29/2024 7:32 AM  Brittany Gomez  has presented today for surgery, with the diagnosis of aflutter.  The various methods of treatment have been discussed with the patient and family. After consideration of risks, benefits and other options for treatment, the patient has consented to  Procedures: TRANSESOPHAGEAL ECHOCARDIOGRAM (N/A) CARDIOVERSION (N/A) as a surgical intervention.  The patient's history has been reviewed, patient examined, no change in status, stable for surgery.  I have reviewed the patient's chart and labs.  Questions were answered to the patient's satisfaction.     Brittany Gomez [name]

## 2024-10-29 NOTE — CV Procedure (Addendum)
" ° ° °  TRANSESOPHAGEAL ECHOCARDIOGRAM   NAME:  Brittany Gomez    MRN: [id number] DOB:  1950-12-02    ADMIT DATE: 10/28/2024  INDICATIONS: Atrial Flutter with RVR  PROCEDURE:  Informed consent was obtained prior to the procedure. The risks, benefits and alternatives for the procedure were discussed and the patient comprehended these risks.  Risks include, but are not limited to, cough, sore throat, vomiting, nausea, somnolence, esophageal and stomach trauma or perforation, bleeding, low blood pressure, aspiration, pneumonia, infection, trauma to the teeth and death.    Procedural time out performed. The oropharynx was anesthetized with topical 1% benzocaine.    Anesthesia was administered by Dr. Epifanio.   The transesophageal probe was inserted in the esophagus and stomach without difficulty and multiple views were obtained.   COMPLICATIONS:   There were no immediate complications.  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 Normal LV systolic function.  Full report to follow. Further management per primary team.   Signed, [name], MD, [facility]  [facility]  8:58 AM  "

## 2024-10-29 NOTE — Anesthesia Postprocedure Evaluation (Signed)
"   Anesthesia Post Note  Patient: Brittany Gomez  Procedure(s) Performed: TRANSESOPHAGEAL ECHOCARDIOGRAM     Patient location during evaluation: Cath Lab Anesthesia Type: MAC Level of consciousness: awake and alert Pain management: pain level controlled Vital Signs Assessment: post-procedure vital signs reviewed and stable Respiratory status: spontaneous breathing, nonlabored ventilation and respiratory function stable Cardiovascular status: stable and blood pressure returned to baseline Postop Assessment: no apparent nausea or vomiting Anesthetic complications: no   No notable events documented.  Last Vitals:  Vitals:   10/29/24 1023 10/29/24 1024  BP:  96/81  Pulse: 87 87  Resp: (!) 25 (!) 25  Temp:    SpO2: 98% 98%    Last Pain:  Vitals:   10/29/24 0713  TempSrc: Temporal  PainSc:                  Francessca Friis,W. EDMOND      "

## 2024-10-29 NOTE — Discharge Instructions (Signed)

## 2024-10-29 NOTE — Anesthesia Preprocedure Evaluation (Addendum)
"                                    Anesthesia Evaluation  Patient identified by MRN, date of birth, ID band Patient awake    Reviewed: Allergy & Precautions, H&P , NPO status , Patient's Chart, lab work & pertinent test results  Airway Mallampati: II  TM Distance: >3 FB Neck ROM: Full    Dental no notable dental hx. (+) Edentulous Upper, Edentulous Lower, Dental Advisory Given   Pulmonary former smoker   Pulmonary exam normal breath sounds clear to auscultation       Cardiovascular hypertension, Pt. on medications + dysrhythmias Atrial Fibrillation  Rhythm:Regular Rate:Normal     Neuro/Psych   Anxiety     negative neurological ROS     GI/Hepatic negative GI ROS, Neg liver ROS,,,  Endo/Other  negative endocrine ROS    Renal/GU negative Renal ROS  negative genitourinary   Musculoskeletal  (+) Arthritis , Osteoarthritis,    Abdominal   Peds  Hematology negative hematology ROS (+)   Anesthesia Other Findings   Reproductive/Obstetrics negative OB ROS                              Anesthesia Physical Anesthesia Plan  ASA: 3  Anesthesia Plan: General   Post-op Pain Management: Minimal or no pain anticipated   Induction: Intravenous  PONV Risk Score and Plan: 3 and Propofol  infusion  Airway Management Planned: Natural Airway and Simple Face Mask  Additional Equipment:   Intra-op Plan:   Post-operative Plan:   Informed Consent: I have reviewed the patients History and Physical, chart, labs and discussed the procedure including the risks, benefits and alternatives for the proposed anesthesia with the patient or authorized representative who has indicated his/her understanding and acceptance.     Dental advisory given  Plan Discussed with: CRNA  Anesthesia Plan Comments:          Anesthesia Quick Evaluation  "

## 2024-10-29 NOTE — Telephone Encounter (Signed)
 Patient Product/process Development Scientist completed.    The patient is insured through [employer]. Patient has Medicare and is not eligible for a copay card, but may be able to apply for patient assistance or Medicare RX Payment Plan (Patient Must reach out to their plan, if eligible for payment plan), if available.    Ran test claim for Eliquis  5 mg and the current 30 day co-pay is $236.79 due to a deductible.  Will be $47.00 once deductible is met.   This test claim was processed through [facility]- copay amounts may vary at other pharmacies due to pharmacy/plan contracts, or as the patient moves through the different stages of their insurance plan.     Reyes Sharps, CPHT Pharmacy Technician Patient Advocate Specialist Lead [facility] Patient Advocate Team Direct Number: [phone]  Fax: [phone]

## 2024-10-30 ENCOUNTER — Encounter (HOSPITAL_COMMUNITY): Payer: Self-pay

## 2024-10-30 ENCOUNTER — Inpatient Hospital Stay (HOSPITAL_COMMUNITY)

## 2024-10-30 DIAGNOSIS — I4892 Unspecified atrial flutter: Secondary | ICD-10-CM | POA: Diagnosis not present

## 2024-10-30 LAB — ECHOCARDIOGRAM COMPLETE BUBBLE STUDY
AR max vel: 2.36 cm2
AV Area VTI: 2.47 cm2
AV Area mean vel: 2.38 cm2
AV Mean grad: 2.1 mmHg
AV Peak grad: 5 mmHg
Ao pk vel: 1.12 m/s
Area-P 1/2: 3.54 cm2
P 1/2 time: 63 ms
S' Lateral: 2.7 cm

## 2024-10-30 LAB — CBC
HCT: 48.6 % — ABNORMAL HIGH (ref 36.0–46.0)
Hemoglobin: 15.1 g/dL — ABNORMAL HIGH (ref 12.0–15.0)
MCH: 30.4 pg (ref 26.0–34.0)
MCHC: 31.1 g/dL (ref 30.0–36.0)
MCV: 97.8 fL (ref 80.0–100.0)
Platelets: 254 K/uL (ref 150–400)
RBC: 4.97 MIL/uL (ref 3.87–5.11)
RDW: 13.2 % (ref 11.5–15.5)
WBC: 12.5 K/uL — ABNORMAL HIGH (ref 4.0–10.5)
nRBC: 0 % (ref 0.0–0.2)

## 2024-10-30 LAB — COMPREHENSIVE METABOLIC PANEL WITH GFR
ALT: 23 U/L (ref 0–44)
AST: 25 U/L (ref 15–41)
Albumin: 4 g/dL (ref 3.5–5.0)
Alkaline Phosphatase: 59 U/L (ref 38–126)
Anion gap: 8 (ref 5–15)
BUN: 15 mg/dL (ref 8–23)
CO2: 32 mmol/L (ref 22–32)
Calcium: 9 mg/dL (ref 8.9–10.3)
Chloride: 101 mmol/L (ref 98–111)
Creatinine, Ser: 0.84 mg/dL (ref 0.44–1.00)
GFR, Estimated: >60 mL/min
Glucose, Bld: 99 mg/dL (ref 70–99)
Potassium: 4 mmol/L (ref 3.5–5.1)
Sodium: 140 mmol/L (ref 135–145)
Total Bilirubin: 0.7 mg/dL (ref 0.0–1.2)
Total Protein: 6.6 g/dL (ref 6.5–8.1)

## 2024-10-30 MED ORDER — METOPROLOL SUCCINATE ER 25 MG PO TB24
25.0000 mg | ORAL_TABLET | Freq: Every day | ORAL | Status: DC
  Administered 2024-10-31: 25 mg via ORAL
  Filled 2024-10-30 (×2): qty 1

## 2024-10-30 MED ORDER — ROSUVASTATIN CALCIUM 20 MG PO TABS: 20.0000 mg | ORAL_TABLET | Freq: Every day | ORAL | 3 refills | Status: DC

## 2024-10-30 MED ORDER — METOPROLOL SUCCINATE ER 25 MG PO TB24: 25.0000 mg | ORAL_TABLET | Freq: Every day | ORAL | 1 refills | Status: DC

## 2024-10-30 MED ORDER — OXYCODONE HCL 5 MG PO TABS
5.0000 mg | ORAL_TABLET | Freq: Four times a day (QID) | ORAL | Status: DC | PRN
  Administered 2024-10-30: 5 mg via ORAL
  Filled 2024-10-30: qty 1

## 2024-10-30 MED ORDER — ROSUVASTATIN CALCIUM 20 MG PO TABS
20.0000 mg | ORAL_TABLET | Freq: Every day | ORAL | Status: DC
  Administered 2024-10-30 – 2024-10-31 (×2): 20 mg via ORAL
  Filled 2024-10-30 (×2): qty 1

## 2024-10-30 MED ORDER — APIXABAN 5 MG PO TABS: 5.0000 mg | ORAL_TABLET | Freq: Two times a day (BID) | ORAL | 11 refills | Status: DC

## 2024-10-30 NOTE — Discharge Summary (Signed)
 " Discharge Summary   Patient ID: Brittany Gomez MRN: [id number]; DOB: [birth year]  Admit date: 10/28/2024 Discharge date: 10/30/2024  PCP:  Arloa Elsie SAUNDERS, MD   [facility] Providers Cardiologist:  Vinie JAYSON Maxcy, MD       Discharge Diagnoses  Active Problems:   New onset atrial flutter [facility])   Diagnostic Studies/Procedures   TEE DCCV 10/29/2024 IMPRESSIONS     1. EF hard to assess given significant tachycardia.   2. Right ventricular systolic function is moderately reduced. The right  ventricular size is moderately enlarged. There is severely elevated  pulmonary artery systolic pressure. The estimated right ventricular  systolic pressure is 79.6 mmHg.   3. There is evidence of irregular layering in LAA suggestive of pectinate  muscle, but clot cannot be ruled out. The LAA emptying velocity was 60  cm/s.   4. Right atrial size was severely dilated.   5. The mitral valve is normal in structure. No evidence of mitral valve  regurgitation.   6. Tricuspid valve regurgitation is moderate to severe.   7. The aortic valve is normal in structure. Aortic valve regurgitation is  not visualized.   8. The inferior vena cava is dilated in size with <50% respiratory  variability, suggesting right atrial pressure of 15 mmHg.   9. Agitated saline contrast bubble study was negative, with no evidence  of any interatrial shunt.   Indication:  Symptomatic atrial flutter   Procedure Note:  The patient signed informed consent. Anesthesia was administered by Dr. Epifanio.  Adequate airway was maintained throughout and vital followed per protocol.  They were cardioverted x 1 with 200J of biphasic synchronized energy.  They converted to NSR.  There were no apparent complications.  The patient had normal neuro status and respiratory status post procedure with vitals stable as recorded elsewhere.     Follow up: They will continue on current medical therapy and follow up with  cardiology as scheduled.    _____________   History of Present Illness   Brittany Gomez is a 73 y.o. female with past medical history of primary hypertension, hypercholesterolemia, and tobacco use who is being seen 10/28/2024 for the evaluation of palpitations.   She presented to [facility] ED this afternoon complaining of intermittent palpitations for the last 4 days (since Friday, 10/24/24). She was first aware of palpitations while asleep and woken up by a high heart rate alert on her smart watch. She was seen at [facility] yesterday afternoon at which time EKG showed sinus tachycardia with ventricular rate of 146; she was then started on metoprolol  tartrate with instructions to return for follow-up the following day. She took one metoprolol  25 mg dose right after that visit, a second dose this morning at 3AM, and a third dose at approximately 10AM today. However, she did not feel any relief or improvement on metoprolol . She returned to [facility] urgent [facility] this morning as instructed, at which time EKG showed atrial flutter and was sent to the ED by ambulance. On presentation at [facility] ED, she was found to be in atrial flutter with rate in the 140s and hypotensive in the 90s/70s.   Upon speaking with patient today, she does not feel any palpitations at this time. Denies any history of palpitations or atrial flutter/fibrillation. Denies CP, SOB, lightheadedness, dizziness, syncope. Admits that she has not been eating or drinking as much as usual due to problems with her dentures.   ED  workup: EKG showed atrial flutter with ventricular rate at 118 bpm. CBC suggestive of hemoconcentration with Hgb 15.7 and HCT 50%. BMP unremarkable. No pleural effusion on CXR.    Hospital Course   Consultants: N/A   Patient was admitted to cardiology service.  Rate control strategy has been limited by hypotension.  Patient ultimately underwent TEE DCCV on 10/25/2024. TEE showed  moderately reduced RV systolic function with RVSP 79.6 mmHg, evidence of irregular layering in the left atrial appendage suggestive of pectinate muscles, but small clot cannot be ruled out.  Right atrial size was severely dilated.  Ejection fraction could not be assessed due to significant tachycardia.  Patient ultimately underwent a single cardioversion with 200 J of biphasic synchronized energy.  She was able to convert to sinus rhythm successfully.  She was seen in the morning of 10/30/2024 at which time she was doing well.  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  Prior to discharge, TTE was ordered.  Otherwise, patient is deemed stable for discharge after that TTE is completed.  Per MD instruction, can follow-up on the results of the TTE as outpatient.     Did the patient have an acute coronary syndrome (MI, NSTEMI, STEMI, etc) this admission?:  No                               Did the patient have a percutaneous coronary intervention (stent / angioplasty)?:  No.          _____________  Discharge Vitals Blood pressure (!) 90/57, pulse 93, temperature 98.3 F (36.8 C), temperature source Oral, resp. rate (!) 26, height 5' 5 (1.651 m), weight 56.7 kg, last menstrual period 11/06/1998, SpO2 91%.  Filed Weights   10/28/24 1320 10/29/24 1326  Weight: 56.7 kg 56.7 kg    Labs & Radiologic Studies  CBC Recent Labs    10/28/24 1324 10/28/24 1434 10/30/24 0827  WBC 10.6*  --  12.5*  HGB 15.7* 17.3* 15.1*  HCT 50.0* 51.0* 48.6*  MCV 99.0  --  97.8  PLT 278  --  254   Basic Metabolic Panel Recent Labs    87/76/74 1855 10/29/24 0519 10/30/24 0827  NA  --  140 140  K  --  4.8 4.0  CL  --  103 101  CO2  --  27 32  GLUCOSE  --  59* 99  BUN  --  21 15  CREATININE  --  0.83 0.84  CALCIUM   --  9.1 9.0  MG 1.7  --   --    Liver Function Tests Recent Labs    10/30/24 0827  AST 25  ALT 23  ALKPHOS 59   BILITOT 0.7  PROT 6.6  ALBUMIN 4.0   No results for input(s): LIPASE, AMYLASE in the last 72 hours. High Sensitivity Troponin:   No results for input(s): TROPONINIHS in the last 720 hours.  No results for input(s): TRNPT in the last 720 hours.  BNP Invalid input(s): POCBNP No results for input(s): PROBNP in the last 72 hours.  No results for input(s): BNP in the last 72 hours.  D-Dimer No results for input(s): DDIMER in the last 72 hours. Hemoglobin A1C No results for input(s): HGBA1C in the last 72 hours. Fasting Lipid Panel No results for input(s): CHOL, HDL, LDLCALC, TRIG, CHOLHDL, LDLDIRECT in the last 72 hours. No results found  for: LIPOA  Thyroid  Function Tests Recent Labs    10/28/24 1855  TSH 1.130   _____________  ECHO TEE Result Date: 10/29/2024    TRANSESOPHOGEAL ECHO REPORT   Patient Name:   [facility] [name] Ehler Date of Exam: 10/29/2024 Medical Rec #:  [id number]            Height:       65.0 in Accession #:    [id number]           Weight:       125.0 lb Date of Birth:  1950-12-25            BSA:          1.620 m Patient Age:    73 years             BP:           112/96 mmHg Patient Gender: F                    HR:           130 bpm. Exam Location:  Inpatient Procedure: 2D Echo, Cardiac Doppler, Color Doppler and Transesophageal Echo            (Both Spectral and Color Flow Doppler were utilized during            procedure). Indications:    Cardioversion  History:        Patient has no prior history of Echocardiogram examinations.  Sonographer:    Tinnie Gosling RDCS Referring Phys: [id number] [facility] K LE PROCEDURE: After discussion of the risks and benefits of a TEE, an informed consent was obtained from the patient. The transesophogeal probe was passed without difficulty through the esophogus of the patient. Sedation performed by different physician. The patient was monitored while under deep sedation. Anesthestetic sedation was provided  intravenously by Anesthesiology: 232.27mg  of Propofol , 60mg  of Lidocaine . The patient developed no complications during the procedure.  IMPRESSIONS  1. EF hard to assess given significant tachycardia.  2. Right ventricular systolic function is moderately reduced. The right ventricular size is moderately enlarged. There is severely elevated pulmonary artery systolic pressure. The estimated right ventricular systolic pressure is 79.6 mmHg.  3. There is evidence of irregular layering in LAA suggestive of pectinate muscle, but clot cannot be ruled out. The LAA emptying velocity was 60 cm/s.  4. Right atrial size was severely dilated.  5. The mitral valve is normal in structure. No evidence of mitral valve regurgitation.  6. Tricuspid valve regurgitation is moderate to severe.  7. The aortic valve is normal in structure. Aortic valve regurgitation is not visualized.  8. The inferior vena cava is dilated in size with <50% respiratory variability, suggesting right atrial pressure of 15 mmHg.  9. Agitated saline contrast bubble study was negative, with no evidence of any interatrial shunt. FINDINGS  Left Ventricle: EF hard to assess given significant tachycardia. The left ventricular internal cavity size was normal in size. Right Ventricle: The right ventricular size is moderately enlarged. Right ventricular systolic function is moderately reduced. There is severely elevated pulmonary artery systolic pressure. The tricuspid regurgitant velocity is 4.02 m/s, and with an assumed right atrial pressure of 15 mmHg, the estimated right ventricular systolic pressure is 79.6 mmHg. Left Atrium: There is evidence of irregular layering in LAA suggestive of pectinate muscle, but clot cannot be ruled out. Left atrial size was normal in size. The LAA emptying velocity was 60 cm/s. Right Atrium:  Right atrial size was severely dilated. Pericardium: Trivial pericardial effusion is present. Mitral Valve: The mitral valve is normal in  structure. No evidence of mitral valve regurgitation. Tricuspid Valve: The tricuspid valve is normal in structure. Tricuspid valve regurgitation is moderate to severe. Aortic Valve: The aortic valve is normal in structure. Aortic valve regurgitation is not visualized. Pulmonic Valve: The pulmonic valve was normal in structure. Pulmonic valve regurgitation is trivial. Aorta: The aortic root is normal in size and structure. Venous: The right upper pulmonary vein is normal. The inferior vena cava is dilated in size with less than 50% respiratory variability, suggesting right atrial pressure of 15 mmHg. IAS/Shunts: No atrial level shunt detected by color flow Doppler. Agitated saline contrast bubble study was negative, with no evidence of any interatrial shunt.  IVC IVC diam: 2.70 cm AORTIC VALVE LVOT Vmax:   54.30 cm/s LVOT Vmean:  39.500 cm/s LVOT VTI:    0.078 m TRICUSPID VALVE TR Peak grad:   64.6 mmHg TR Vmax:        402.00 cm/s  SHUNTS Systemic VTI: 0.08 m Joelle Cedars Tonleu Electronically signed by Joelle Cedars Tonleu Signature Date/Time: 10/29/2024/10:50:44 AM    Final    EP STUDY Result Date: 10/29/2024 See surgical note for result.  EP STUDY Result Date: 10/29/2024 See surgical note for result.  DG Chest Port 1 View Result Date: 10/28/2024 CLINICAL DATA:  Tachycardia. EXAM: PORTABLE CHEST 1 VIEW COMPARISON:  December 13, 2021 FINDINGS: The heart size and mediastinal contours are within normal limits. There is mild calcification of the aortic arch. The lungs are hyperinflated with evidence of emphysematous lung disease with mild linear scarring and/or atelectasis is seen within the retrocardiac region of the left lung base. No pleural effusion or pneumothorax is identified. Multilevel degenerative changes are seen throughout the thoracic spine. IMPRESSION: COPD with mild left basilar linear scarring and/or atelectasis. Electronically Signed   By: Suzen Dials M.D.   On: 10/28/2024 15:33    CT CHEST LUNG CA SCREEN LOW DOSE W/O CM Result Date: 10/17/2024 CLINICAL DATA:  73 year old female current smoker 38.5 pack-year smoking history. EXAM: CT CHEST WITHOUT CONTRAST LOW-DOSE FOR LUNG CANCER SCREENING TECHNIQUE: Multidetector CT imaging of the chest was performed following the standard protocol without IV contrast. RADIATION DOSE REDUCTION: This exam was performed according to the departmental dose-optimization program which includes automated exposure control, adjustment of the mA and/or kV according to patient size and/or use of iterative reconstruction technique. COMPARISON:  10/12/2023 screening chest CT FINDINGS: Cardiovascular: Normal heart size. No significant pericardial effusion/thickening. Three-vessel coronary atherosclerosis. Atherosclerotic nonaneurysmal thoracic aorta. Normal caliber pulmonary arteries. Mediastinum/Nodes: No significant thyroid  nodules. Unremarkable esophagus. No pathologically enlarged axillary, mediastinal or hilar lymph nodes, noting limited sensitivity for the detection of hilar adenopathy on this noncontrast study. Lungs/Pleura: No pneumothorax. No pleural effusion. Marked centrilobular emphysema with diffuse bronchial wall thickening. No acute consolidative airspace disease or lung masses. Several scattered small solid pulmonary nodules up to 3.9 mm in the subpleural right upper lobe anteriorly on image 173, all stable. No new significant pulmonary nodules. Chronic bandlike scarring in the anterior left lower lobe, unchanged. Upper abdomen: No acute abnormality. Musculoskeletal: No aggressive appearing focal osseous lesions. Moderate thoracic spondylosis. IMPRESSION: 1. Lung-RADS 2, benign appearance or behavior. Continue annual screening with low-dose chest CT without contrast in 12 months. 2. Three-vessel coronary atherosclerosis. 3. Aortic Atherosclerosis (ICD10-I70.0) and Emphysema (ICD10-J43.9). Electronically Signed   By: Selinda DELENA Blue M.D.   On: 10/17/2024  17:40  Disposition Pt is being discharged home today in good condition.  Follow-up Plans & Appointments  Follow-up Information     Mona Vinie BROCKS, MD Follow up on 11/17/2024.   Specialty: Cardiology Why: @9 :20AM. Cardiology follow up Contact information: [address] [place] [address] [phone]                  Discharge Medications Allergies as of 10/30/2024       Reactions   Mobic  [meloxicam ] Other (See Comments)   Unknown reaction        Medication List     STOP taking these medications    amLODipine 5 MG tablet Commonly known as: NORVASC   metoprolol  tartrate 25 MG tablet Commonly known as: LOPRESSOR    pravastatin  40 MG tablet Commonly known as: PRAVACHOL        TAKE these medications    apixaban  5 MG Tabs tablet Commonly known as: ELIQUIS  Take 1 tablet (5 mg total) by mouth 2 (two) times daily.   clonazePAM 1 MG tablet Commonly known as: KLONOPIN Take 1 mg by mouth 2 (two) times daily as needed for anxiety.   levocetirizine 5 MG tablet Commonly known as: XYZAL Take 5 mg by mouth daily as needed for allergies.   metoprolol  succinate 25 MG 24 hr tablet Commonly known as: TOPROL -XL Take 1 tablet (25 mg total) by mouth daily. Start taking on: October 31, 2024   mirtazapine  30 MG tablet Commonly known as: REMERON  Take 15-30 mg by mouth at bedtime as needed (sleep).   rosuvastatin  20 MG tablet Commonly known as: CRESTOR  Take 1 tablet (20 mg total) by mouth daily. Start taking on: October 31, 2024   tiZANidine 4 MG tablet Commonly known as: ZANAFLEX Take 4 mg by mouth 2 (two) times daily as needed for muscle spasms.         Outstanding Labs/Studies N/A  Duration of Discharge Encounter: APP Time: 15 minutes   Signed, Scot Ford, PA 10/30/2024, 2:14 PM     "

## 2024-10-30 NOTE — Progress Notes (Addendum)
 Notified by nursing that patient uncomfortable with discharge today motivated by family assistance and lack of transportation home. Also had to hold metoprolol  today due to SBP 90 earlier today. Echo also pending. Patient in need of PT eval to ensure no HH needs prior to dc. Will hold on discharge, order PT eval, and continue to monitor with plan for DC in AM if stable. Last BP 102/71. Will also give Dr. Cherrie LIPS re: echo pending.  Since Hao Meng PA-C did discharge already, rounding team will need to review/notify pharmacy in AM if any meds different than planned discharge - patient preferred [facility] @ [address] as listed.

## 2024-10-30 NOTE — Progress Notes (Signed)
 Echocardiogram 2D Echocardiogram has been performed.  Brittany Gomez 10/30/2024, 4:04 PM

## 2024-10-30 NOTE — Plan of Care (Signed)

## 2024-10-30 NOTE — Progress Notes (Signed)
"  °  Progress Note Patient Name: Brittany Gomez Date of Encounter: 10/30/2024 [facility] Cardiologist: Brittany JAYSON Maxcy, MD   Interval Summary   No events overnight. She denies any chest palpitations or dyspnea, but just feels fatigued.   Vital Signs Vitals:   10/29/24 1650 10/29/24 2015 10/29/24 2329 10/30/24 0415  BP: 105/76 101/73 92/62 96/66   Pulse: 87 84 74 69  Resp: 20 (!) 23 13 (!) 23  Temp: 97.8 F (36.6 C) 97.7 F (36.5 C) 98 F (36.7 C) 97.8 F (36.6 C)  TempSrc: Oral Oral Oral Oral  SpO2: 97% 96% 92% 96%  Weight:      Height:        Intake/Output Summary (Last 24 hours) at 10/30/2024 0800 Last data filed at 10/30/2024 0417 Gross per 24 hour  Intake 1312.37 ml  Output --  Net 1312.37 ml      10/29/2024    1:26 PM 10/28/2024    1:20 PM 12/14/2020    2:43 PM  Last 3 Weights  Weight (lbs) 125 lb 125 lb 157 lb 6.4 oz  Weight (kg) 56.7 kg 56.7 kg 71.396 kg      Telemetry/ECG  NSR 80S - Personally Reviewed  Physical Exam  GEN: No acute distress.   Neck: No JVD Cardiac: RRR, no murmurs, rubs, or gallops.  Respiratory: Clear to auscultation bilaterally. GI: Soft, nontender, non-distended  MS: No edema  Assessment & Plan  [name] is z 48 yoF with Hx of HTN, HLD, and significant tobacco use with presented with Aflutter with RVR s/p TEE DCCV on 12/24.   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

## 2024-10-31 ENCOUNTER — Other Ambulatory Visit (HOSPITAL_COMMUNITY): Payer: Self-pay

## 2024-10-31 DIAGNOSIS — I50812 Chronic right heart failure: Secondary | ICD-10-CM

## 2024-10-31 DIAGNOSIS — J439 Emphysema, unspecified: Secondary | ICD-10-CM

## 2024-10-31 DIAGNOSIS — I2510 Atherosclerotic heart disease of native coronary artery without angina pectoris: Secondary | ICD-10-CM

## 2024-10-31 DIAGNOSIS — I272 Pulmonary hypertension, unspecified: Secondary | ICD-10-CM | POA: Diagnosis not present

## 2024-10-31 DIAGNOSIS — I4892 Unspecified atrial flutter: Secondary | ICD-10-CM | POA: Diagnosis not present

## 2024-10-31 MED ORDER — METOPROLOL SUCCINATE ER 25 MG PO TB24
25.0000 mg | ORAL_TABLET | Freq: Every day | ORAL | 1 refills | Status: DC
  Filled 2024-10-31: qty 90, 90d supply, fill #0

## 2024-10-31 MED ORDER — ROSUVASTATIN CALCIUM 20 MG PO TABS
20.0000 mg | ORAL_TABLET | Freq: Every day | ORAL | 3 refills | Status: DC
  Filled 2024-10-31: qty 90, 90d supply, fill #0

## 2024-10-31 MED ORDER — APIXABAN 5 MG PO TABS
5.0000 mg | ORAL_TABLET | Freq: Two times a day (BID) | ORAL | 0 refills | Status: AC
  Filled 2024-10-31: qty 60, 30d supply, fill #0

## 2024-10-31 MED ORDER — APIXABAN 5 MG PO TABS
5.0000 mg | ORAL_TABLET | Freq: Two times a day (BID) | ORAL | 0 refills | Status: DC
  Filled 2024-10-31: qty 60, 30d supply, fill #0

## 2024-10-31 MED ORDER — APIXABAN 5 MG PO TABS: 5.0000 mg | ORAL_TABLET | Freq: Two times a day (BID) | ORAL | 11 refills | Status: AC

## 2024-10-31 NOTE — Plan of Care (Signed)

## 2024-10-31 NOTE — Progress Notes (Signed)
 DISCHARGE NOTE HOME Brittany Gomez to be discharged Home per MD order. Discussed prescriptions and follow up appointments with the patient. Prescriptions given to patient; medication list explained in detail. Patient verbalized understanding.  Skin clean, dry and intact without evidence of skin break down, no evidence of skin tears noted. IV catheter discontinued intact. Site without signs and symptoms of complications. Dressing and pressure applied. Pt denies pain at the site currently. No complaints noted.  Patient free of lines, drains, and wounds.   An After Visit Summary (AVS) was printed and given to the patient. Patient escorted via wheelchair, and discharged home via private auto.  Peyton SHAUNNA Pepper, RN

## 2024-10-31 NOTE — Progress Notes (Signed)
 "  Rounding Note   Patient Name: Brittany Gomez Date of Encounter: 10/31/2024  [facility] Cardiologist: Vinie JAYSON Maxcy, MD   Subjective  Smoked for more than 15 years. Never seen a pulmonologist. Denies any CP or SOB.   Scheduled Meds:  apixaban   5 mg Oral BID   metoprolol  succinate  25 mg Oral Daily   rosuvastatin   20 mg Oral Daily   Continuous Infusions:  PRN Meds: acetaminophen , ondansetron  (ZOFRAN ) IV, oxyCODONE    Vital Signs  Vitals:   10/30/24 2000 10/30/24 2359 10/31/24 0000 10/31/24 0418  BP: 95/60 94/62 94/62  106/72  Pulse: 90 82  71  Resp: 16 (!) 21 (!) 28 (!) 22  Temp: 98 F (36.7 C) 98 F (36.7 C)  98 F (36.7 C)  TempSrc: Oral Oral  Oral  SpO2: 90% 90% (!) 88% 97%  Weight:      Height:        Intake/Output Summary (Last 24 hours) at 10/31/2024 0755 Last data filed at 10/30/2024 2000 Gross per 24 hour  Intake 360 ml  Output --  Net 360 ml      10/29/2024    1:26 PM 10/28/2024    1:20 PM 12/14/2020    2:43 PM  Last 3 Weights  Weight (lbs) 125 lb 125 lb 157 lb 6.4 oz  Weight (kg) 56.7 kg 56.7 kg 71.396 kg      Telemetry NSR with HR 70-90s - Personally Reviewed  ECG  NSR with R axis deviation, poor R wave progression in the anterior leads, TWI in V1-V2 - Personally Reviewed  Physical Exam  GEN: No acute distress.   Neck: No JVD Cardiac: RRR, no murmurs, rubs, or gallops.  Respiratory: Clear to auscultation bilaterally. GI: Soft, nontender, non-distended  MS: No edema; No deformity. Neuro:  Nonfocal  Psych: Normal affect   Labs High Sensitivity Troponin:  No results for input(s): TROPONINIHS in the last 720 hours.   Chemistry Recent Labs  Lab 10/28/24 1324 10/28/24 1434 10/28/24 1855 10/29/24 0519 10/30/24 0827  NA 140 141  --  140 140  K 4.7 4.3  --  4.8 4.0  CL 101 99  --  103 101  CO2 31  --   --  27 32  GLUCOSE 82 86  --  59* 99  BUN 21 23  --  21 15  CREATININE 0.90 1.00  --  0.83 0.84  CALCIUM  9.5   --   --  9.1 9.0  MG  --   --  1.7  --   --   PROT  --   --   --   --  6.6  ALBUMIN  --   --   --   --  4.0  AST  --   --   --   --  25  ALT  --   --   --   --  23  ALKPHOS  --   --   --   --  59  BILITOT  --   --   --   --  0.7  GFRNONAA >60  --   --  >60 >60  ANIONGAP 8  --   --  11 8    Lipids No results for input(s): CHOL, TRIG, HDL, LABVLDL, LDLCALC, CHOLHDL in the last 168 hours.  Hematology Recent Labs  Lab 10/28/24 1324 10/28/24 1434 10/30/24 0827  WBC 10.6*  --  12.5*  RBC 5.05  --  4.97  HGB 15.7* 17.3* 15.1*  HCT 50.0* 51.0* 48.6*  MCV 99.0  --  97.8  MCH 31.1  --  30.4  MCHC 31.4  --  31.1  RDW 13.1  --  13.2  PLT 278  --  254   Thyroid   Recent Labs  Lab 10/28/24 1855  TSH 1.130    BNPNo results for input(s): BNP, PROBNP in the last 168 hours.  DDimer No results for input(s): DDIMER in the last 168 hours.   Radiology  ECHOCARDIOGRAM COMPLETE BUBBLE STUDY Result Date: 10/30/2024    ECHOCARDIOGRAM REPORT   Patient Name:   Brittany Gomez Brittany Gomez Date of Exam: 10/30/2024 Medical Rec #:  [id number]            Height: Accession #:    [id number]           Weight: Date of Birth:  10/13/1951            BSA: Patient Age:    73 years             BP:           102/71 mmHg Patient Gender: F                    HR:           89 bpm. Exam Location:  Inpatient Procedure: 2D Echo, Cardiac Doppler, Color Doppler and Saline Contrast Bubble            Study (Both Spectral and Color Flow Doppler were utilized during            procedure). Indications:    Atrial Flutter  History:        Patient has no prior history of Echocardiogram examinations.                 Arrythmias:Atrial Flutter; Risk Factors:Hypertension and Former                 Smoker.  Sonographer:    Merlynn Argyle Referring Phys: [id number] [facility] [name]  Sonographer Comments: Image acquisition challenging due to respiratory motion. IMPRESSIONS  1. Left ventricular ejection fraction, by estimation, is 60  to 65%. The left ventricle has normal function. The left ventricle has no regional wall motion abnormalities. Left ventricular diastolic parameters are indeterminate. There is the interventricular septum is flattened in systole and diastole, consistent with right ventricular pressure and volume overload.  2. Right ventricular systolic function is moderately reduced. The right ventricular size is severely enlarged. There is severely elevated pulmonary artery systolic pressure.  3. Left atrial size was severely dilated.  4. Right atrial size was severely dilated.  5. The mitral valve is normal in structure. No evidence of mitral valve regurgitation. No evidence of mitral stenosis.  6. The tricuspid valve is abnormal. Tricuspid valve regurgitation is moderate.  7. The aortic valve has an indeterminant number of cusps. There is mild calcification of the aortic valve. There is mild thickening of the aortic valve. Aortic valve regurgitation is not visualized. No aortic stenosis is present.  8. The inferior vena cava is dilated in size with <50% respiratory variability, suggesting right atrial pressure of 15 mmHg.  9. Agitated saline contrast bubble study was negative, with no evidence of any interatrial shunt. FINDINGS  Left Ventricle: Left ventricular ejection fraction, by estimation, is 60 to 65%. The left ventricle has normal function. The left ventricle has no regional wall motion abnormalities. The left ventricular internal cavity size  was normal in size. There is  no left ventricular hypertrophy. The interventricular septum is flattened in systole and diastole, consistent with right ventricular pressure and volume overload. Left ventricular diastolic parameters are indeterminate. Right Ventricle: The right ventricular size is severely enlarged. Right vetricular wall thickness was not well visualized. Right ventricular systolic function is moderately reduced. There is severely elevated pulmonary artery systolic  pressure. The tricuspid regurgitant velocity is 3.92 m/s, and with an assumed right atrial pressure of 15 mmHg, the estimated right ventricular systolic pressure is 76.5 mmHg. Left Atrium: Left atrial size was severely dilated. Right Atrium: Right atrial size was severely dilated. Pericardium: There is no evidence of pericardial effusion. Mitral Valve: The mitral valve is normal in structure. No evidence of mitral valve regurgitation. No evidence of mitral valve stenosis. Tricuspid Valve: The tricuspid valve is abnormal. Tricuspid valve regurgitation is moderate . No evidence of tricuspid stenosis. Aortic Valve: The aortic valve has an indeterminant number of cusps. There is mild calcification of the aortic valve. There is mild thickening of the aortic valve. There is mild aortic valve annular calcification. Aortic valve regurgitation is not visualized. No aortic stenosis is present. Aortic valve mean gradient measures 2.1 mmHg. Aortic valve peak gradient measures 5.0 mmHg. Aortic valve area, by VTI measures 2.47 cm. Pulmonic Valve: The pulmonic valve was not well visualized. Pulmonic valve regurgitation is not visualized. No evidence of pulmonic stenosis. Aorta: The aortic root is normal in size and structure and the ascending aorta was not well visualized. Venous: The inferior vena cava is dilated in size with less than 50% respiratory variability, suggesting right atrial pressure of 15 mmHg. IAS/Shunts: No atrial level shunt detected by color flow Doppler. Agitated saline contrast was given intravenously to evaluate for intracardiac shunting. Agitated saline contrast bubble study was negative, with no evidence of any interatrial shunt.  LEFT VENTRICLE PLAX 2D LVIDd:         4.00 cm   Diastology LVIDs:         2.70 cm   LV e' medial:    9.03 cm/s LV PW:         1.00 cm   LV E/e' medial:  10.3 LV IVS:        0.90 cm   LV e' lateral:   14.90 cm/s LVOT diam:     2.00 cm   LV E/e' lateral: 6.3 LV SV:         47 LVOT  Area:     3.14 cm  RIGHT VENTRICLE RV Basal diam:  4.00 cm RV Mid diam:    3.40 cm RV Length:      6.80 cm RV S prime:     1990.00 cm/s TAPSE (M-mode): 1.7 cm LEFT ATRIUM LA Vol (A2C): 59.2 ml LA Vol (A4C): 78.8 ml  AORTIC VALVE AV Area (Vmax):    2.36 cm AV Area (Vmean):   2.38 cm AV Area (VTI):     2.47 cm AV Vmax:           112.29 cm/s AV Vmean:          65.414 cm/s AV VTI:            0.189 m AV Peak Grad:      5.0 mmHg AV Mean Grad:      2.1 mmHg LVOT Vmax:         84.37 cm/s LVOT Vmean:        49.563 cm/s LVOT VTI:  0.148 m LVOT/AV VTI ratio: 0.79  AORTA Ao Root diam: 3.50 cm MITRAL VALVE                 TRICUSPID VALVE MV Area (PHT): 3.54 cm      TR Peak grad:   61.5 mmHg MV PHT:        63.00 msec    TR Vmax:        392.00 cm/s MV Decel Time: 214 msec MV E velocity: 93.30 cm/s    SHUNTS MV A velocity: 8490.00 cm/s  Systemic VTI:  0.15 m MV E/A ratio:  0.01          Systemic Diam: 2.00 cm Dorn Ross MD Electronically signed by Dorn Ross MD Signature Date/Time: 10/30/2024/5:08:53 PM    Final    ECHO TEE Result Date: 10/29/2024    TRANSESOPHOGEAL ECHO REPORT   Patient Name:   [facility] Brittany Earnhardt Date of Exam: 10/29/2024 Medical Rec #:  [id number]            Height:       65.0 in Accession #:    [id number]           Weight:       125.0 lb Date of Birth:  05-03-1951            BSA:          1.620 m Patient Age:    73 years             BP:           112/96 mmHg Patient Gender: F                    HR:           130 bpm. Exam Location:  Inpatient Procedure: 2D Echo, Cardiac Doppler, Color Doppler and Transesophageal Echo            (Both Spectral and Color Flow Doppler were utilized during            procedure). Indications:    Cardioversion  History:        Patient has no prior history of Echocardiogram examinations.  Sonographer:    Tinnie Gosling RDCS Referring Phys: [id number] [facility] K LE PROCEDURE: After discussion of the risks and benefits of a TEE, an informed consent was obtained from  the patient. The transesophogeal probe was passed without difficulty through the esophogus of the patient. Sedation performed by different physician. The patient was monitored while under deep sedation. Anesthestetic sedation was provided intravenously by Anesthesiology: 232.27mg  of Propofol , 60mg  of Lidocaine . The patient developed no complications during the procedure.  IMPRESSIONS  1. EF hard to assess given significant tachycardia.  2. Right ventricular systolic function is moderately reduced. The right ventricular size is moderately enlarged. There is severely elevated pulmonary artery systolic pressure. The estimated right ventricular systolic pressure is 79.6 mmHg.  3. There is evidence of irregular layering in LAA suggestive of pectinate muscle, but clot cannot be ruled out. The LAA emptying velocity was 60 cm/s.  4. Right atrial size was severely dilated.  5. The mitral valve is normal in structure. No evidence of mitral valve regurgitation.  6. Tricuspid valve regurgitation is moderate to severe.  7. The aortic valve is normal in structure. Aortic valve regurgitation is not visualized.  8. The inferior vena cava is dilated in size with <50% respiratory variability, suggesting right atrial pressure of 15 mmHg.  9. Agitated saline contrast  bubble study was negative, with no evidence of any interatrial shunt. FINDINGS  Left Ventricle: EF hard to assess given significant tachycardia. The left ventricular internal cavity size was normal in size. Right Ventricle: The right ventricular size is moderately enlarged. Right ventricular systolic function is moderately reduced. There is severely elevated pulmonary artery systolic pressure. The tricuspid regurgitant velocity is 4.02 m/s, and with an assumed right atrial pressure of 15 mmHg, the estimated right ventricular systolic pressure is 79.6 mmHg. Left Atrium: There is evidence of irregular layering in LAA suggestive of pectinate muscle, but clot cannot be ruled  out. Left atrial size was normal in size. The LAA emptying velocity was 60 cm/s. Right Atrium: Right atrial size was severely dilated. Pericardium: Trivial pericardial effusion is present. Mitral Valve: The mitral valve is normal in structure. No evidence of mitral valve regurgitation. Tricuspid Valve: The tricuspid valve is normal in structure. Tricuspid valve regurgitation is moderate to severe. Aortic Valve: The aortic valve is normal in structure. Aortic valve regurgitation is not visualized. Pulmonic Valve: The pulmonic valve was normal in structure. Pulmonic valve regurgitation is trivial. Aorta: The aortic root is normal in size and structure. Venous: The right upper pulmonary vein is normal. The inferior vena cava is dilated in size with less than 50% respiratory variability, suggesting right atrial pressure of 15 mmHg. IAS/Shunts: No atrial level shunt detected by color flow Doppler. Agitated saline contrast bubble study was negative, with no evidence of any interatrial shunt.  IVC IVC diam: 2.70 cm AORTIC VALVE LVOT Vmax:   54.30 cm/s LVOT Vmean:  39.500 cm/s LVOT VTI:    0.078 m TRICUSPID VALVE TR Peak grad:   64.6 mmHg TR Vmax:        402.00 cm/s  SHUNTS Systemic VTI: 0.08 m Joelle Cedars Tonleu Electronically signed by Joelle Cedars Tonleu Signature Date/Time: 10/29/2024/10:50:44 AM    Final    EP STUDY Result Date: 10/29/2024 See surgical note for result.  EP STUDY Result Date: 10/29/2024 See surgical note for result.   Cardiac Studies  TTE 10/30/2024 1. Left ventricular ejection fraction, by estimation, is 60 to 65%. The  left ventricle has normal function. The left ventricle has no regional  wall motion abnormalities. Left ventricular diastolic parameters are  indeterminate. There is the  interventricular septum is flattened in systole and diastole, consistent  with right ventricular pressure and volume overload.   2. Right ventricular systolic function is moderately reduced. The  right  ventricular size is severely enlarged. There is severely elevated  pulmonary artery systolic pressure.   3. Left atrial size was severely dilated.   4. Right atrial size was severely dilated.   5. The mitral valve is normal in structure. No evidence of mitral valve  regurgitation. No evidence of mitral stenosis.   6. The tricuspid valve is abnormal. Tricuspid valve regurgitation is  moderate.   7. The aortic valve has an indeterminant number of cusps. There is mild  calcification of the aortic valve. There is mild thickening of the aortic  valve. Aortic valve regurgitation is not visualized. No aortic stenosis is  present.   8. The inferior vena cava is dilated in size with <50% respiratory  variability, suggesting right atrial pressure of 15 mmHg.   9. Agitated saline contrast bubble study was negative, with no evidence  of any interatrial shunt.    Patient Profile   72 y.o. female with PMH of HTN, HLD, tobacco use, lung nodule (followed by annual screening CT by  PCP) and emphysema (seen on previous CT) who presented with atrial flutter with RVR.   Assessment & Plan   Atrial flutter with RVR  - underwent TEE DCCV on 10/29/2024  - continue metoprolol  succinate and Eliquis . Maintaining NSR. DC yesterday was delayed by hypotension and lack of transport. Systolic BP remain borderline low, SBP 94-106. PT eval today and potentially DC afterward.   Dilated right ventricle - TEE on 12/24 showed moderately enlarged RV with RVSP 79.6 mmHg. Moderate to severe TR. Severely dilated right atrium.   - TTE on 12/25 showed EF 60-65%, interventricular septum flattened in systole and diastole consistent with RV pressure and volume overload. Severely elevated PASP, moderate reduced RV systolic function, severe biatrial enlargement, moderate TR.  - question if need to obtain either d-dimer or CTA of chest PE protocol to rule out PE cause for dilated RV, patient was started on Eliquis  during this  hospitalization. - consider pulmonology referral as outpatient. 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

## 2024-10-31 NOTE — Evaluation (Signed)
 Physical Therapy Evaluation Patient Details Name: Brittany Gomez MRN: [id number] DOB: 1951-01-27 Today's Date: 10/31/2024  History of Present Illness  73 y.o. female presents to [facility] 10/28/24 from urgent care due to atrial flutter and hypotension. 12/24 TEE guided cardioversion. PMHx: hypertension, dyslipidemia and tobacco use  Clinical Impression  PTA pt was independent for mobility and ADLs/iADLs with no AD. Pt reported being at mobility baseline being independent to stand and ModI to ambulate 261ft with no AD. Pt will have 24/7 assist available upon d/c home. Pt has no further questions/concerns and feels comfortable to d/c home. No further acute or post-acute PT needs with acute PT signing off. Please re-consult if new needs arise.          If plan is discharge home, recommend the following: Help with stairs or ramp for entrance;Assist for transportation   Can travel by private vehicle    Yes    Equipment Recommendations None recommended by PT     Functional Status Assessment Patient has not had a recent decline in their functional status     Precautions / Restrictions Precautions Precautions: Fall Recall of Precautions/Restrictions: Intact Restrictions Weight Bearing Restrictions Per Provider Order: No      Mobility  Bed Mobility  General bed mobility comments: NT, seated on EOB upon arrival    Transfers Overall transfer level: Independent Equipment used: None     Ambulation/Gait Ambulation/Gait assistance: Modified independent (Device/Increase time) Gait Distance (Feet): 200 Feet Assistive device: None Gait Pattern/deviations: WFL(Within Functional Limits) Gait velocity: slightly decreased     General Gait Details: steady gait with no overt LOB     Balance Overall balance assessment: Modified Independent       Pertinent Vitals/Pain Pain Assessment Pain Assessment: No/denies pain    Home Living Family/patient expects to be discharged to::  Private residence Living Arrangements: Spouse/significant other Available Help at Discharge: Family;Available 24 hours/day Type of Home: House Home Access: Stairs to enter Entrance Stairs-Rails: None Entrance Stairs-Number of Steps: 1   Home Layout: One level Home Equipment: Grab bars - tub/shower      Prior Function Prior Level of Function : Independent/Modified Independent;Driving    Mobility Comments: Ind with no AD, denies falls ADLs Comments: Ind     Extremity/Trunk Assessment   Upper Extremity Assessment Upper Extremity Assessment: Overall WFL for tasks assessed    Lower Extremity Assessment Lower Extremity Assessment: Overall WFL for tasks assessed    Cervical / Trunk Assessment Cervical / Trunk Assessment: Normal  Communication   Communication Communication: No apparent difficulties    Cognition Arousal: Alert Behavior During Therapy: WFL for tasks assessed/performed   PT - Cognitive impairments: No apparent impairments    Following commands: Intact       Cueing Cueing Techniques: Verbal cues      PT Assessment Patient does not need any further PT services         PT Goals (Current goals can be found in the Care Plan section)  Acute Rehab PT Goals PT Goal Formulation: All assessment and education complete, DC therapy     AM-PAC PT 6 Clicks Mobility  Outcome Measure Help needed turning from your back to your side while in a flat bed without using bedrails?: None Help needed moving from lying on your back to sitting on the side of a flat bed without using bedrails?: None Help needed moving to and from a bed to a chair (including a wheelchair)?: None Help needed standing up from a chair using  your arms (e.g., wheelchair or bedside chair)?: None Help needed to walk in hospital room?: None Help needed climbing 3-5 steps with a railing? : None 6 Click Score: 24    End of Session   Activity Tolerance: Patient tolerated treatment well Patient  left: in bed;with call bell/phone within reach Nurse Communication: Mobility status PT Visit Diagnosis: Other abnormalities of gait and mobility (R26.89)    Time: 9162-9149 PT Time Calculation (min) (ACUTE ONLY): 13 min   Charges:   PT Evaluation $PT Eval Low Complexity: 1 Low   PT General Charges $$ ACUTE PT VISIT: 1 Visit       Kate ORN, PT, DPT Secure Chat Preferred  Rehab Office [phone]   Kate BRAVO Brittany Gomez 10/31/2024, 9:05 AM

## 2024-10-31 NOTE — Discharge Summary (Signed)
 " Discharge Summary   Patient ID: Brittany Gomez MRN: [id number]; DOB: 1951-05-17  Admit date: 10/28/2024 Discharge date: 10/31/2024  PCP:  Arloa Elsie SAUNDERS, MD   [facility] Providers Cardiologist:  Vinie JAYSON Maxcy, MD       Discharge Diagnoses  Active Problems:   New onset atrial flutter [facility])   Diagnostic Studies/Procedures   TEE DCCV 10/29/2024 IMPRESSIONS     1. EF hard to assess given significant tachycardia.   2. Right ventricular systolic function is moderately reduced. The right  ventricular size is moderately enlarged. There is severely elevated  pulmonary artery systolic pressure. The estimated right ventricular  systolic pressure is 79.6 mmHg.   3. There is evidence of irregular layering in LAA suggestive of pectinate  muscle, but clot cannot be ruled out. The LAA emptying velocity was 60  cm/s.   4. Right atrial size was severely dilated.   5. The mitral valve is normal in structure. No evidence of mitral valve  regurgitation.   6. Tricuspid valve regurgitation is moderate to severe.   7. The aortic valve is normal in structure. Aortic valve regurgitation is  not visualized.   8. The inferior vena cava is dilated in size with <50% respiratory  variability, suggesting right atrial pressure of 15 mmHg.   9. Agitated saline contrast bubble study was negative, with no evidence  of any interatrial shunt.   Indication:  Symptomatic atrial flutter   Procedure Note:  The patient signed informed consent. Anesthesia was administered by Dr. Epifanio.  Adequate airway was maintained throughout and vital followed per protocol.  They were cardioverted x 1 with 200J of biphasic synchronized energy.  They converted to NSR.  There were no apparent complications.  The patient had normal neuro status and respiratory status post procedure with vitals stable as recorded elsewhere.     Follow up: They will continue on current medical therapy and follow up with  cardiology as scheduled.    _____________   History of Present Illness   Brittany Gomez is a 73 y.o. female with past medical history of primary hypertension, hypercholesterolemia, and tobacco use who is being seen 10/28/2024 for the evaluation of palpitations.   She presented to [facility] ED this afternoon complaining of intermittent palpitations for the last 4 days (since Friday, 10/24/24). She was first aware of palpitations while asleep and woken up by a high heart rate alert on her smart watch. She was seen at [facility] yesterday afternoon at which time EKG showed sinus tachycardia with ventricular rate of 146; she was then started on metoprolol  tartrate with instructions to return for follow-up the following day. She took one metoprolol  25 mg dose right after that visit, a second dose this morning at 3AM, and a third dose at approximately 10AM today. However, she did not feel any relief or improvement on metoprolol . She returned to [facility] this morning as instructed, at which time EKG showed atrial flutter and was sent to the ED by ambulance. On presentation at [facility] ED, she was found to be in atrial flutter with rate in the 140s and hypotensive in the 90s/70s.   Upon speaking with patient today, she does not feel any palpitations at this time. Denies any history of palpitations or atrial flutter/fibrillation. Denies CP, SOB, lightheadedness, dizziness, syncope. Admits that she has not been eating or drinking as much as usual due to problems with her dentures.   ED  workup: EKG showed atrial flutter with ventricular rate at 118 bpm. CBC suggestive of hemoconcentration with Hgb 15.7 and HCT 50%. BMP unremarkable. No pleural effusion on CXR.    Hospital Course   Consultants: N/A   Patient was admitted to cardiology service.  Rate control strategy has been limited by hypotension.  Patient ultimately underwent TEE DCCV on 10/25/2024. TEE showed  moderately reduced RV systolic function with RVSP 79.6 mmHg, evidence of irregular layering in the left atrial appendage suggestive of pectinate muscles, but small clot cannot be ruled out.  Right atrial size was severely dilated.  Ejection fraction could not be assessed due to significant tachycardia.  Patient ultimately underwent a single cardioversion with 200 J of biphasic synchronized energy.  She was able to convert to sinus rhythm successfully.  She was seen in the morning of 10/30/2024 at which time she was doing well.  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  Echocardiogram obtained on 12/25 showed EF 60-65%, interventricular septum flattened in systole and diastole consistent with RV pressure and volume overload. Severely elevated PASP, moderate reduced RV systolic function, severe biatrial enlargement, moderate TR.   Initial plan was to discharge patient on 12/25, discharge was delayed due to hypotension and transportation issues.  She was kept in the hospital for 1 more day.  She was seen in the morning of 10/31/2024 at which time she was doing well maintaining sinus rhythm.  She has previously established with pulmonology service as outpatient, she will need to follow-up with her pulmonologist for further evaluation and likely pulmonary function test.  She is deemed stable for discharge from a cardiac perspective.     Did the patient have an acute coronary syndrome (MI, NSTEMI, STEMI, etc) this admission?:  No                               Did the patient have a percutaneous coronary intervention (stent / angioplasty)?:  No.          _____________  Discharge Vitals Blood pressure 99/71, pulse 72, temperature 98.4 F (36.9 C), temperature source Oral, resp. rate (!) 22, height 5' 5 (1.651 m), weight 56.7 kg, last menstrual period 11/06/1998, SpO2 93%.  Filed Weights   10/28/24 1320 10/29/24 1326  Weight:  56.7 kg 56.7 kg    Labs & Radiologic Studies  CBC Recent Labs    10/28/24 1324 10/28/24 1434 10/30/24 0827  WBC 10.6*  --  12.5*  HGB 15.7* 17.3* 15.1*  HCT 50.0* 51.0* 48.6*  MCV 99.0  --  97.8  PLT 278  --  254   Basic Metabolic Panel Recent Labs    87/76/74 1855 10/29/24 0519 10/30/24 0827  NA  --  140 140  K  --  4.8 4.0  CL  --  103 101  CO2  --  27 32  GLUCOSE  --  59* 99  BUN  --  21 15  CREATININE  --  0.83 0.84  CALCIUM   --  9.1 9.0  MG 1.7  --   --    Liver Function Tests Recent Labs    10/30/24 0827  AST 25  ALT 23  ALKPHOS 59  BILITOT 0.7  PROT 6.6  ALBUMIN 4.0   No results for input(s): LIPASE, AMYLASE in the last 72 hours. High Sensitivity Troponin:   No results for input(s): TROPONINIHS  in the last 720 hours.  No results for input(s): TRNPT in the last 720 hours.  BNP Invalid input(s): POCBNP No results for input(s): PROBNP in the last 72 hours.  No results for input(s): BNP in the last 72 hours.  D-Dimer No results for input(s): DDIMER in the last 72 hours. Hemoglobin A1C No results for input(s): HGBA1C in the last 72 hours. Fasting Lipid Panel No results for input(s): CHOL, HDL, LDLCALC, TRIG, CHOLHDL, LDLDIRECT in the last 72 hours. No results found for: LIPOA  Thyroid  Function Tests Recent Labs    10/28/24 1855  TSH 1.130   _____________  ECHOCARDIOGRAM COMPLETE BUBBLE STUDY Result Date: 10/30/2024    ECHOCARDIOGRAM REPORT   Patient Name:   Brittany Gomez Date of Exam: 10/30/2024 Medical Rec #:  [id number]            Height: Accession #:    [id number]           Weight: Date of Birth:  12-27-1950            BSA: Patient Age:    73 years             BP:           102/71 mmHg Patient Gender: F                    HR:           89 bpm. Exam Location:  Inpatient Procedure: 2D Echo, Cardiac Doppler, Color Doppler and Saline Contrast Bubble            Study (Both Spectral and Color Flow Doppler were  utilized during            procedure). Indications:    Atrial Flutter  History:        Patient has no prior history of Echocardiogram examinations.                 Arrythmias:Atrial Flutter; Risk Factors:Hypertension and Former                 Smoker.  Sonographer:    Merlynn Argyle Referring Phys: [id number] [facility] [name]  Sonographer Comments: Image acquisition challenging due to respiratory motion. IMPRESSIONS  1. Left ventricular ejection fraction, by estimation, is 60 to 65%. The left ventricle has normal function. The left ventricle has no regional wall motion abnormalities. Left ventricular diastolic parameters are indeterminate. There is the interventricular septum is flattened in systole and diastole, consistent with right ventricular pressure and volume overload.  2. Right ventricular systolic function is moderately reduced. The right ventricular size is severely enlarged. There is severely elevated pulmonary artery systolic pressure.  3. Left atrial size was severely dilated.  4. Right atrial size was severely dilated.  5. The mitral valve is normal in structure. No evidence of mitral valve regurgitation. No evidence of mitral stenosis.  6. The tricuspid valve is abnormal. Tricuspid valve regurgitation is moderate.  7. The aortic valve has an indeterminant number of cusps. There is mild calcification of the aortic valve. There is mild thickening of the aortic valve. Aortic valve regurgitation is not visualized. No aortic stenosis is present.  8. The inferior vena cava is dilated in size with <50% respiratory variability, suggesting right atrial pressure of 15 mmHg.  9. Agitated saline contrast bubble study was negative, with no evidence of any interatrial shunt. FINDINGS  Left Ventricle: Left ventricular ejection fraction, by estimation, is 60 to 65%. The  left ventricle has normal function. The left ventricle has no regional wall motion abnormalities. The left ventricular internal cavity size was normal  in size. There is  no left ventricular hypertrophy. The interventricular septum is flattened in systole and diastole, consistent with right ventricular pressure and volume overload. Left ventricular diastolic parameters are indeterminate. Right Ventricle: The right ventricular size is severely enlarged. Right vetricular wall thickness was not well visualized. Right ventricular systolic function is moderately reduced. There is severely elevated pulmonary artery systolic pressure. The tricuspid regurgitant velocity is 3.92 m/s, and with an assumed right atrial pressure of 15 mmHg, the estimated right ventricular systolic pressure is 76.5 mmHg. Left Atrium: Left atrial size was severely dilated. Right Atrium: Right atrial size was severely dilated. Pericardium: There is no evidence of pericardial effusion. Mitral Valve: The mitral valve is normal in structure. No evidence of mitral valve regurgitation. No evidence of mitral valve stenosis. Tricuspid Valve: The tricuspid valve is abnormal. Tricuspid valve regurgitation is moderate . No evidence of tricuspid stenosis. Aortic Valve: The aortic valve has an indeterminant number of cusps. There is mild calcification of the aortic valve. There is mild thickening of the aortic valve. There is mild aortic valve annular calcification. Aortic valve regurgitation is not visualized. No aortic stenosis is present. Aortic valve mean gradient measures 2.1 mmHg. Aortic valve peak gradient measures 5.0 mmHg. Aortic valve area, by VTI measures 2.47 cm. Pulmonic Valve: The pulmonic valve was not well visualized. Pulmonic valve regurgitation is not visualized. No evidence of pulmonic stenosis. Aorta: The aortic root is normal in size and structure and the ascending aorta was not well visualized. Venous: The inferior vena cava is dilated in size with less than 50% respiratory variability, suggesting right atrial pressure of 15 mmHg. IAS/Shunts: No atrial level shunt detected by color flow  Doppler. Agitated saline contrast was given intravenously to evaluate for intracardiac shunting. Agitated saline contrast bubble study was negative, with no evidence of any interatrial shunt.  LEFT VENTRICLE PLAX 2D LVIDd:         4.00 cm   Diastology LVIDs:         2.70 cm   LV e' medial:    9.03 cm/s LV PW:         1.00 cm   LV E/e' medial:  10.3 LV IVS:        0.90 cm   LV e' lateral:   14.90 cm/s LVOT diam:     2.00 cm   LV E/e' lateral: 6.3 LV SV:         47 LVOT Area:     3.14 cm  RIGHT VENTRICLE RV Basal diam:  4.00 cm RV Mid diam:    3.40 cm RV Length:      6.80 cm RV S prime:     1990.00 cm/s TAPSE (M-mode): 1.7 cm LEFT ATRIUM LA Vol (A2C): 59.2 ml LA Vol (A4C): 78.8 ml  AORTIC VALVE AV Area (Vmax):    2.36 cm AV Area (Vmean):   2.38 cm AV Area (VTI):     2.47 cm AV Vmax:           112.29 cm/s AV Vmean:          65.414 cm/s AV VTI:            0.189 m AV Peak Grad:      5.0 mmHg AV Mean Grad:      2.1 mmHg LVOT Vmax:  84.37 cm/s LVOT Vmean:        49.563 cm/s LVOT VTI:          0.148 m LVOT/AV VTI ratio: 0.79  AORTA Ao Root diam: 3.50 cm MITRAL VALVE                 TRICUSPID VALVE MV Area (PHT): 3.54 cm      TR Peak grad:   61.5 mmHg MV PHT:        63.00 msec    TR Vmax:        392.00 cm/s MV Decel Time: 214 msec MV E velocity: 93.30 cm/s    SHUNTS MV A velocity: 8490.00 cm/s  Systemic VTI:  0.15 m MV E/A ratio:  0.01          Systemic Diam: 2.00 cm Dorn Ross MD Electronically signed by Dorn Ross MD Signature Date/Time: 10/30/2024/5:08:53 PM    Final    ECHO TEE Result Date: 10/29/2024    TRANSESOPHOGEAL ECHO REPORT   Patient Name:   Brittany Gomez Date of Exam: 10/29/2024 Medical Rec #:  [id number]            Height:       65.0 in Accession #:    [id number]           Weight:       125.0 lb Date of Birth:  [birth year]            BSA:          1.620 m Patient Age:    73 years             BP:           112/96 mmHg Patient Gender: F                    HR:           130 bpm. Exam  Location:  Inpatient Procedure: 2D Echo, Cardiac Doppler, Color Doppler and Transesophageal Echo            (Both Spectral and Color Flow Doppler were utilized during            procedure). Indications:    Cardioversion  History:        Patient has no prior history of Echocardiogram examinations.  Sonographer:    Tinnie Gosling RDCS Referring Phys: [id number] [facility] K LE PROCEDURE: After discussion of the risks and benefits of a TEE, an informed consent was obtained from the patient. The transesophogeal probe was passed without difficulty through the esophogus of the patient. Sedation performed by different physician. The patient was monitored while under deep sedation. Anesthestetic sedation was provided intravenously by Anesthesiology: 232.27mg  of Propofol , 60mg  of Lidocaine . The patient developed no complications during the procedure.  IMPRESSIONS  1. EF hard to assess given significant tachycardia.  2. Right ventricular systolic function is moderately reduced. The right ventricular size is moderately enlarged. There is severely elevated pulmonary artery systolic pressure. The estimated right ventricular systolic pressure is 79.6 mmHg.  3. There is evidence of irregular layering in LAA suggestive of pectinate muscle, but clot cannot be ruled out. The LAA emptying velocity was 60 cm/s.  4. Right atrial size was severely dilated.  5. The mitral valve is normal in structure. No evidence of mitral valve regurgitation.  6. Tricuspid valve regurgitation is moderate to severe.  7. The aortic valve is normal in structure. Aortic valve regurgitation is not visualized.  8.  The inferior vena cava is dilated in size with <50% respiratory variability, suggesting right atrial pressure of 15 mmHg.  9. Agitated saline contrast bubble study was negative, with no evidence of any interatrial shunt. FINDINGS  Left Ventricle: EF hard to assess given significant tachycardia. The left ventricular internal cavity size was normal in size.  Right Ventricle: The right ventricular size is moderately enlarged. Right ventricular systolic function is moderately reduced. There is severely elevated pulmonary artery systolic pressure. The tricuspid regurgitant velocity is 4.02 m/s, and with an assumed right atrial pressure of 15 mmHg, the estimated right ventricular systolic pressure is 79.6 mmHg. Left Atrium: There is evidence of irregular layering in LAA suggestive of pectinate muscle, but clot cannot be ruled out. Left atrial size was normal in size. The LAA emptying velocity was 60 cm/s. Right Atrium: Right atrial size was severely dilated. Pericardium: Trivial pericardial effusion is present. Mitral Valve: The mitral valve is normal in structure. No evidence of mitral valve regurgitation. Tricuspid Valve: The tricuspid valve is normal in structure. Tricuspid valve regurgitation is moderate to severe. Aortic Valve: The aortic valve is normal in structure. Aortic valve regurgitation is not visualized. Pulmonic Valve: The pulmonic valve was normal in structure. Pulmonic valve regurgitation is trivial. Aorta: The aortic root is normal in size and structure. Venous: The right upper pulmonary vein is normal. The inferior vena cava is dilated in size with less than 50% respiratory variability, suggesting right atrial pressure of 15 mmHg. IAS/Shunts: No atrial level shunt detected by color flow Doppler. Agitated saline contrast bubble study was negative, with no evidence of any interatrial shunt.  IVC IVC diam: 2.70 cm AORTIC VALVE LVOT Vmax:   54.30 cm/s LVOT Vmean:  39.500 cm/s LVOT VTI:    0.078 m TRICUSPID VALVE TR Peak grad:   64.6 mmHg TR Vmax:        402.00 cm/s  SHUNTS Systemic VTI: 0.08 m Joelle Cedars Tonleu Electronically signed by Joelle Cedars Tonleu Signature Date/Time: 10/29/2024/10:50:44 AM    Final    EP STUDY Result Date: 10/29/2024 See surgical note for result.  EP STUDY Result Date: 10/29/2024 See surgical note for result.  DG Chest  Port 1 View Result Date: 10/28/2024 CLINICAL DATA:  Tachycardia. EXAM: PORTABLE CHEST 1 VIEW COMPARISON:  December 13, 2021 FINDINGS: The heart size and mediastinal contours are within normal limits. There is mild calcification of the aortic arch. The lungs are hyperinflated with evidence of emphysematous lung disease with mild linear scarring and/or atelectasis is seen within the retrocardiac region of the left lung base. No pleural effusion or pneumothorax is identified. Multilevel degenerative changes are seen throughout the thoracic spine. IMPRESSION: COPD with mild left basilar linear scarring and/or atelectasis. Electronically Signed   By: Suzen Dials M.D.   On: 10/28/2024 15:33   CT CHEST LUNG CA SCREEN LOW DOSE W/O CM Result Date: 10/17/2024 CLINICAL DATA:  73 year old female current smoker 38.5 pack-year smoking history. EXAM: CT CHEST WITHOUT CONTRAST LOW-DOSE FOR LUNG CANCER SCREENING TECHNIQUE: Multidetector CT imaging of the chest was performed following the standard protocol without IV contrast. RADIATION DOSE REDUCTION: This exam was performed according to the departmental dose-optimization program which includes automated exposure control, adjustment of the mA and/or kV according to patient size and/or use of iterative reconstruction technique. COMPARISON:  10/12/2023 screening chest CT FINDINGS: Cardiovascular: Normal heart size. No significant pericardial effusion/thickening. Three-vessel coronary atherosclerosis. Atherosclerotic nonaneurysmal thoracic aorta. Normal caliber pulmonary arteries. Mediastinum/Nodes: No significant thyroid  nodules. Unremarkable esophagus. No  pathologically enlarged axillary, mediastinal or hilar lymph nodes, noting limited sensitivity for the detection of hilar adenopathy on this noncontrast study. Lungs/Pleura: No pneumothorax. No pleural effusion. Marked centrilobular emphysema with diffuse bronchial wall thickening. No acute consolidative airspace disease  or lung masses. Several scattered small solid pulmonary nodules up to 3.9 mm in the subpleural right upper lobe anteriorly on image 173, all stable. No new significant pulmonary nodules. Chronic bandlike scarring in the anterior left lower lobe, unchanged. Upper abdomen: No acute abnormality. Musculoskeletal: No aggressive appearing focal osseous lesions. Moderate thoracic spondylosis. IMPRESSION: 1. Lung-RADS 2, benign appearance or behavior. Continue annual screening with low-dose chest CT without contrast in 12 months. 2. Three-vessel coronary atherosclerosis. 3. Aortic Atherosclerosis (ICD10-I70.0) and Emphysema (ICD10-J43.9). Electronically Signed   By: Selinda DELENA Blue M.D.   On: 10/17/2024 17:40    Disposition Pt is being discharged home today in good condition.  Follow-up Plans & Appointments  Follow-up Information     Mona Vinie BROCKS, MD Follow up on 11/17/2024.   Specialty: Cardiology Why: @9 :20AM. Cardiology follow up Contact information: [address] [place] [id number] [phone]                Discharge Instructions     Increase activity slowly   Complete by: As directed        Discharge Medications Allergies as of 10/31/2024       Reactions   Mobic  [meloxicam ] Other (See Comments)   Unknown reaction        Medication List     STOP taking these medications    amLODipine 5 MG tablet Commonly known as: NORVASC   metoprolol  tartrate 25 MG tablet Commonly known as: LOPRESSOR    pravastatin  40 MG tablet Commonly known as: PRAVACHOL        TAKE these medications    apixaban  5 MG Tabs tablet Commonly known as: ELIQUIS  Take 1 tablet (5 mg total) by mouth 2 (two) times daily.   clonazePAM 1 MG tablet Commonly known as: KLONOPIN Take 1 mg by mouth 2 (two) times daily as needed for anxiety.   levocetirizine 5 MG tablet Commonly known as: XYZAL Take 5 mg by mouth daily as needed for allergies.   metoprolol  succinate 25 MG 24 hr  tablet Commonly known as: TOPROL -XL Take 1 tablet (25 mg total) by mouth daily.   mirtazapine  30 MG tablet Commonly known as: REMERON  Take 15-30 mg by mouth at bedtime as needed (sleep).   rosuvastatin  20 MG tablet Commonly known as: CRESTOR  Take 1 tablet (20 mg total) by mouth daily.   tiZANidine 4 MG tablet Commonly known as: ZANAFLEX Take 4 mg by mouth 2 (two) times daily as needed for muscle spasms.          Outstanding Labs/Studies N/A  Duration of Discharge Encounter: APP Time: 15 minutes   Signed, Scot Ford, PA 10/31/2024, 11:07 AM     "

## 2024-11-04 ENCOUNTER — Other Ambulatory Visit: Payer: Self-pay | Admitting: Family Medicine

## 2024-11-04 DIAGNOSIS — Z1231 Encounter for screening mammogram for malignant neoplasm of breast: Secondary | ICD-10-CM

## 2024-11-11 ENCOUNTER — Inpatient Hospital Stay: Admission: RE | Admit: 2024-11-11 | Discharge: 2024-11-11 | Attending: Family Medicine

## 2024-11-11 DIAGNOSIS — Z1231 Encounter for screening mammogram for malignant neoplasm of breast: Secondary | ICD-10-CM

## 2024-11-17 ENCOUNTER — Ambulatory Visit: Attending: Internal Medicine | Admitting: Internal Medicine

## 2024-11-18 ENCOUNTER — Telehealth: Payer: Self-pay | Admitting: Internal Medicine

## 2024-11-18 NOTE — Telephone Encounter (Signed)
 Patient had days mixed up and came out a day late. Rescheduled next week and on wait list. While at the window patient did mention she was having swelling from medication and is becoming overwhelmed.

## 2024-11-20 NOTE — Progress Notes (Signed)
 "  Cardiology Office Note    Date:  11/21/2024  ID:  Brittany Gomez 11-Jun-1951, MRN [id number] PCP:  Brittany Elsie SAUNDERS, MD  Cardiologist:  Brittany JAYSON Maxcy, MD  Electrophysiologist:  None   Chief Complaint: Follow up for atrial flutter   History of Present Illness: Brittany Gomez    Brittany Gomez is a 74 y.o. female with visit-pertinent history of primary hypertension, hypercholesterolemia, tobacco use, lung nodule, emphysema and atrial flutter.  On 10/28/2024 patient presented to Brittany Gomez, ED after complaining of intermittent potation's for the prior 4 days.  Patient reports she was first aware of palpitations while asleep and was woken up with a high heart rate alert on her watch.  Patient was seen at [facility] urgent care, EKG showed sinus tachycardia with ventricular rate of 146, was started on metoprolol  tartrate with instructions to return for follow-up following day.  Patient did not feel any significant relief with metoprolol .  On follow-up EKG showed atrial flutter and was sent to the ED.  On arrival to the ED she was found to be in atrial flutter with rate in the 140s and hypotensive in the 90s over 70s.  EKG showed atrial flutter with ventricular rate of 118 bpm.  Patient was admitted to cardiology service rate control strategy was limited by hypotension.  Patient underwent TEE DCCV on 10/29/2024.  TEE showed moderately reduced RV systolic function with RVSP 79.6 mmHg, evidence of irregular laying in the left atrial appendage suggestive of pectinate muscles,, ejection fraction could not be assessed due to significant tachycardia, patient underwent single cardioversion with 200 J of biphasic synchronized energy.  She was converted to sinus rhythm successfully.  Echocardiogram on 10/30/2024 showed EF 66 5%, interventricular septum flattened in systole and diastole consistent with RV pressure and volume overload.  Patient with severely elevated PASP, moderate reduced RV systolic  function, severe biatrial large meant and moderate TR. 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   Patient was ready for discharge on 12/25 however discharge was delayed due to hypotension and transportation problems.  Patient was seen in the morning on 1226 at which time she was maintaining sinus rhythm.,  Is recommended that she follow-up with her pulmonologist for PFTs.  Today she presents for follow-up.  She reports that she is doing very well overall, reports that following her discharge from the hospital she discontinued tobacco use, has noted improvements in her breathing with this.  She denies any chest pain, palpitations, orthopnea or PND.  She denies any feelings of being back in atrial flutter, reports that her Apple Watch has not notified her of any tachycardia.  She notes that she has had some intermittent ankle swelling, typically more associated on the left, notes that she does have some varicose veins.  Patient notes that she has been rather emotional the last few weeks and has been overwhelmed with her medical care.  She notes that her son is soon to be moving to the area. ROS: .   Today she denies chest pain, fatigue, palpitations, melena, hematuria, hemoptysis, diaphoresis, weakness, presyncope, syncope, orthopnea, and PND.  All other systems are reviewed and otherwise negative. Studies Reviewed: Brittany Gomez   EKG:  EKG is ordered today, personally reviewed, demonstrating  EKG Interpretation Date/Time:  Friday November 21 2024 08:20:11 EST Ventricular Rate:  100 PR Interval:  150 QRS Duration:  72 QT Interval:  318 QTC Calculation: 410 R Axis:  190  Text Interpretation: Normal sinus rhythm Left atrial enlargement Right superior axis deviation Possible Right ventricular hypertrophy Inferior infarct , age undetermined Cannot rule out Anteroseptal infarct (cited on or before 29-Oct-2024) When compared with ECG of  29-Oct-2024 10:24, Premature ventricular complexes are no longer Present Questionable change in initial forces of Septal leads Confirmed by Brittany Gomez [phone]) on 11/21/2024 12:20:50 PM   CV Studies: Cardiac studies reviewed are outlined and summarized above. Otherwise please see EMR for full report. Cardiac Studies & Procedures   ______________________________________________________________________________________________     ECHOCARDIOGRAM  ECHOCARDIOGRAM COMPLETE BUBBLE STUDY 10/30/2024  Narrative ECHOCARDIOGRAM REPORT    Patient Name:   Brittany Gomez Date of Exam: 10/30/2024 Medical Rec #:  [id number]            Height: Accession #:    [id number]           Weight: Date of Birth:  Feb 11, 1951            BSA: Patient Age:    73 years             BP:           102/71 mmHg Patient Gender: F                    HR:           89 bpm. Exam Location:  Inpatient  Procedure: 2D Echo, Cardiac Doppler, Color Doppler and Saline Contrast Bubble Study (Both Spectral and Color Flow Doppler were utilized during procedure).  Indications:    Atrial Flutter  History:        Patient has no prior history of Echocardiogram examinations. Arrythmias:Atrial Flutter; Risk Factors:Hypertension and Former Smoker.  Sonographer:    Brittany Gomez Referring Phys: [id number] [facility] Brittany Gomez   Sonographer Comments: Image acquisition challenging due to respiratory motion. IMPRESSIONS   1. Left ventricular ejection fraction, by estimation, is 60 to 65%. The left ventricle has normal function. The left ventricle has no regional wall motion abnormalities. Left ventricular diastolic parameters are indeterminate. There is the interventricular septum is flattened in systole and diastole, consistent with right ventricular pressure and volume overload. 2. Right ventricular systolic function is moderately reduced. The right ventricular size is severely enlarged. There is severely elevated pulmonary artery  systolic pressure. 3. Left atrial size was severely dilated. 4. Right atrial size was severely dilated. 5. The mitral valve is normal in structure. No evidence of mitral valve regurgitation. No evidence of mitral stenosis. 6. The tricuspid valve is abnormal. Tricuspid valve regurgitation is moderate. 7. The aortic valve has an indeterminant number of cusps. There is mild calcification of the aortic valve. There is mild thickening of the aortic valve. Aortic valve regurgitation is not visualized. No aortic stenosis is present. 8. The inferior vena cava is dilated in size with <50% respiratory variability, suggesting right atrial pressure of 15 mmHg. 9. Agitated saline contrast bubble study was negative, with no evidence of any interatrial shunt.  FINDINGS Left Ventricle: Left ventricular ejection fraction, by estimation, is 60 to 65%. The left ventricle has normal function. The left ventricle has no regional wall motion abnormalities. The left ventricular internal cavity size was normal in size. There is no left ventricular hypertrophy. The interventricular septum is flattened in systole and diastole, consistent with right ventricular pressure and volume overload. Left ventricular diastolic parameters are indeterminate.  Right Ventricle: The right ventricular size is severely enlarged. Right vetricular wall thickness was not well  visualized. Right ventricular systolic function is moderately reduced. There is severely elevated pulmonary artery systolic pressure. The tricuspid regurgitant velocity is 3.92 m/s, and with an assumed right atrial pressure of 15 mmHg, the estimated right ventricular systolic pressure is 76.5 mmHg.  Left Atrium: Left atrial size was severely dilated.  Right Atrium: Right atrial size was severely dilated.  Pericardium: There is no evidence of pericardial effusion.  Mitral Valve: The mitral valve is normal in structure. No evidence of mitral valve regurgitation. No  evidence of mitral valve stenosis.  Tricuspid Valve: The tricuspid valve is abnormal. Tricuspid valve regurgitation is moderate . No evidence of tricuspid stenosis.  Aortic Valve: The aortic valve has an indeterminant number of cusps. There is mild calcification of the aortic valve. There is mild thickening of the aortic valve. There is mild aortic valve annular calcification. Aortic valve regurgitation is not visualized. No aortic stenosis is present. Aortic valve mean gradient measures 2.1 mmHg. Aortic valve peak gradient measures 5.0 mmHg. Aortic valve area, by VTI measures 2.47 cm.  Pulmonic Valve: The pulmonic valve was not well visualized. Pulmonic valve regurgitation is not visualized. No evidence of pulmonic stenosis.  Aorta: The aortic root is normal in size and structure and the ascending aorta was not well visualized.  Venous: The inferior vena cava is dilated in size with less than 50% respiratory variability, suggesting right atrial pressure of 15 mmHg.  IAS/Shunts: No atrial level shunt detected by color flow Doppler. Agitated saline contrast was given intravenously to evaluate for intracardiac shunting. Agitated saline contrast bubble study was negative, with no evidence of any interatrial shunt.   LEFT VENTRICLE PLAX 2D LVIDd:         4.00 cm   Diastology LVIDs:         2.70 cm   LV e' medial:    9.03 cm/s LV PW:         1.00 cm   LV E/e' medial:  10.3 LV IVS:        0.90 cm   LV e' lateral:   14.90 cm/s LVOT diam:     2.00 cm   LV E/e' lateral: 6.3 LV SV:         47 LVOT Area:     3.14 cm   RIGHT VENTRICLE RV Basal diam:  4.00 cm RV Mid diam:    3.40 cm RV Length:      6.80 cm RV S prime:     1990.00 cm/s TAPSE (M-mode): 1.7 cm  LEFT ATRIUM LA Vol (A2C): 59.2 ml LA Vol (A4C): 78.8 ml AORTIC VALVE AV Area (Vmax):    2.36 cm AV Area (Vmean):   2.38 cm AV Area (VTI):     2.47 cm AV Vmax:           112.29 cm/s AV Vmean:          65.414 cm/s AV VTI:             0.189 m AV Peak Grad:      5.0 mmHg AV Mean Grad:      2.1 mmHg LVOT Vmax:         84.37 cm/s LVOT Vmean:        49.563 cm/s LVOT VTI:          0.148 m LVOT/AV VTI ratio: 0.79  AORTA Ao Root diam: 3.50 cm  MITRAL VALVE                 TRICUSPID  VALVE MV Area (PHT): 3.54 cm      TR Peak grad:   61.5 mmHg MV PHT:        63.00 msec    TR Vmax:        392.00 cm/s MV Decel Time: 214 msec MV E velocity: 93.30 cm/s    SHUNTS MV A velocity: 8490.00 cm/s  Systemic VTI:  0.15 m MV E/A ratio:  0.01          Systemic Diam: 2.00 cm  Dorn Ross MD Electronically signed by Dorn Ross MD Signature Date/Time: 10/30/2024/5:08:53 PM    Final   TEE  ECHO TEE 10/29/2024  Narrative TRANSESOPHOGEAL ECHO REPORT    Patient Name:   [facility] [name] Groene Date of Exam: 10/29/2024 Medical Rec #:  [id number]            Height:       65.0 in Accession #:    [id number]           Weight:       125.0 lb Date of Birth:  Dec 27, 1950            BSA:          1.620 m Patient Age:    73 years             BP:           112/96 mmHg Patient Gender: F                    HR:           130 bpm. Exam Location:  Inpatient  Procedure: 2D Echo, Cardiac Doppler, Color Doppler and Transesophageal Echo (Both Spectral and Color Flow Doppler were utilized during procedure).  Indications:    Cardioversion  History:        Patient has no prior history of Echocardiogram examinations.  Sonographer:    Tinnie Gosling RDCS Referring Phys: [id number] [facility] K LE  PROCEDURE: After discussion of the risks and benefits of a TEE, an informed consent was obtained from the patient. The transesophogeal probe was passed without difficulty through the esophogus of the patient. Sedation performed by different physician. The patient was monitored while under deep sedation. Anesthestetic sedation was provided intravenously by Anesthesiology: 232.27mg  of Propofol , 60mg  of Lidocaine . The patient developed no complications during  the procedure.  IMPRESSIONS   1. EF hard to assess given significant tachycardia. 2. Right ventricular systolic function is moderately reduced. The right ventricular size is moderately enlarged. There is severely elevated pulmonary artery systolic pressure. The estimated right ventricular systolic pressure is 79.6 mmHg. 3. There is evidence of irregular layering in LAA suggestive of pectinate muscle, but clot cannot be ruled out. The LAA emptying velocity was 60 cm/s. 4. Right atrial size was severely dilated. 5. The mitral valve is normal in structure. No evidence of mitral valve regurgitation. 6. Tricuspid valve regurgitation is moderate to severe. 7. The aortic valve is normal in structure. Aortic valve regurgitation is not visualized. 8. The inferior vena cava is dilated in size with <50% respiratory variability, suggesting right atrial pressure of 15 mmHg. 9. Agitated saline contrast bubble study was negative, with no evidence of any interatrial shunt.  FINDINGS Left Ventricle: EF hard to assess given significant tachycardia. The left ventricular internal cavity size was normal in size.  Right Ventricle: The right ventricular size is moderately enlarged. Right ventricular systolic function is moderately reduced. There is severely elevated pulmonary artery systolic pressure. The  tricuspid regurgitant velocity is 4.02 m/s, and with an assumed right atrial pressure of 15 mmHg, the estimated right ventricular systolic pressure is 79.6 mmHg.  Left Atrium: There is evidence of irregular layering in LAA suggestive of pectinate muscle, but clot cannot be ruled out. Left atrial size was normal in size. The LAA emptying velocity was 60 cm/s.  Right Atrium: Right atrial size was severely dilated.  Pericardium: Trivial pericardial effusion is present.  Mitral Valve: The mitral valve is normal in structure. No evidence of mitral valve regurgitation.  Tricuspid Valve: The tricuspid valve is  normal in structure. Tricuspid valve regurgitation is moderate to severe.  Aortic Valve: The aortic valve is normal in structure. Aortic valve regurgitation is not visualized.  Pulmonic Valve: The pulmonic valve was normal in structure. Pulmonic valve regurgitation is trivial.  Aorta: The aortic root is normal in size and structure.  Venous: The right upper pulmonary vein is normal. The inferior vena cava is dilated in size with less than 50% respiratory variability, suggesting right atrial pressure of 15 mmHg.  IAS/Shunts: No atrial level shunt detected by color flow Doppler. Agitated saline contrast bubble study was negative, with no evidence of any interatrial shunt.   IVC IVC diam: 2.70 cm  AORTIC VALVE LVOT Vmax:   54.30 cm/s LVOT Vmean:  39.500 cm/s LVOT VTI:    0.078 m  TRICUSPID VALVE TR Peak grad:   64.6 mmHg TR Vmax:        402.00 cm/s  SHUNTS Systemic VTI: 0.08 m  Joelle Cedars Gomez Electronically signed by Joelle Cedars Gomez Signature Date/Time: 10/29/2024/10:50:44 AM    Final        ______________________________________________________________________________________________       Current Reported Medications:.    Active Medications[1]  Physical Exam:    VS:  BP 106/60   Pulse 86   Ht 5' 5 (1.651 m)   Wt 133 lb 9.6 oz (60.6 kg)   LMP 11/06/1998   SpO2 96%   BMI 22.23 kg/m    Wt Readings from Last 3 Encounters:  11/21/24 133 lb 9.6 oz (60.6 kg)  10/29/24 125 lb (56.7 kg)  12/14/20 157 lb 6.4 oz (71.4 kg)    GEN: Well nourished, well developed in no acute distress NECK: No JVD; No carotid bruits CARDIAC: RRR, no murmurs, rubs, gallops RESPIRATORY:  Clear to auscultation without rales, wheezing or rhonchi  ABDOMEN: Soft, non-tender, non-distended EXTREMITIES:  No edema; No acute deformity     Asessement and Plan:.    Atrial flutter: Patient presented with atrial flutter in 10/2024, underwent TEE DCCV on 10/29/2024 with successful  single cardioversion with 200 J of biphasic synchronized energy.  She was continued on metoprolol , has remained in sinus rhythm.  She denies any palpitations or feeling of irregular heartbeats.  She monitors on her Apple watch, has not received any alerts or notifications of tachycardia.  She denies any bleeding problems on Eliquis .  Patient requests referral to atrial fibrillation clinic.  Reviewed EKG today with EP team, shows sinus tachycardia at 100 bpm, patient reports she has not yet taken her metoprolol  this morning, she plans to take when she returns home.  Encouraged patient to continue monitoring her heart rate and to notify the office of increased palpitations.  Reviewed ED precautions.  Continue Eliquis  5 mg twice daily and metoprolol  succinate 25 mg daily.  Check CBC and Cmet today. Check CBC and CMET today.   Pulmonary HTN/Dilated RA/RV: Echocardiogram showed preserved EF, severely elevated PASP,  moderately reduced RV systolic function and severe RV enlargement.  Was felt that the right heart changes were chronic and not acute.  Is recommended that she follow-up with pulmonology for PFTs.  Today she reports that she is doing well, reports that her breathing has been stable, has noted improvements since she did discontinue tobacco use following her hospitalization.  She appears euvolemic and well compensated on exam. Patient requests referral to pulmonology, reports was only followed for lung cancer screenings.   Coronary artery calcium : CT chest in 10/2024 indicated three-vessel coronary atherosclerosis. Today patient reports that she is doing well, denies any chest pain or increased shortness of breath.  Patient reports that she remains very active and tolerates well.  Discussed ischemic evaluation, patient notes that she is simply too overwhelmed at this time to consider any further testing, agreeable to further discussing on follow-up.  Reviewed ED precautions.  Continue Eliquis  and  Crestor .  Lower extremity edema: Patient reports that she has intermittent swelling at her ankles, typically more so on the left.  Patient notes history of varicose veins.  Patient reports that this improves with elevation and progresses as she is on her feet for prolonged periods of time.  Today she appears euvolemic and well compensated on exam.  Recommended reduce salt intake and elevation of feet as well as wearing of compression stockings.  HLD: Lipoprotein a elevated at 122.  LDL goal less than 70.Patient transitioned from pravastatin  to Crestor  20 mg daily while inpatient. Check fasting lipid profile and LFTs in 6 weeks, if not at goal consider PCSK9 inhibitor.  Tobacco use:  Reports she quit smoking after her hospitalization. Congratulated.    Disposition: F/u with Dr. Mona or Harrold Fitchett, NP in 2-3 months per patient request   Signed, Laron Angelini D Raman Featherston, NP       [1]  Current Meds  Medication Sig   apixaban  (ELIQUIS ) 5 MG TABS tablet Take 1 tablet (5 mg total) by mouth 2 (two) times daily. First month supply, then refills from [facility]   apixaban  (ELIQUIS ) 5 MG TABS tablet Take 1 tablet (5 mg total) by mouth 2 (two) times daily. To start 12/01/24 (after 1 mo supply from [place] ends)   [DISCONTINUED] metoprolol  succinate (TOPROL -XL) 25 MG 24 hr tablet Take 1 tablet (25 mg total) by mouth daily.   [DISCONTINUED] rosuvastatin  (CRESTOR ) 20 MG tablet Take 1 tablet (20 mg total) by mouth daily.   "

## 2024-11-20 NOTE — Telephone Encounter (Signed)
 Spoke with patient - new to outpatient cardiology, was seen in hospital OV 11/21/24 with K. West NP  She has bilateral ankle edema She has gained 5-6 lbs over 1 month - weight today: 133.8, weight Monday: 133.8 Does not weigh every day  Complaint with Eliquis  - no bleeding concerns Compliant with Toprol  XL, crestor    No further assistance needed.

## 2024-11-20 NOTE — Telephone Encounter (Signed)
 Pt c/o swelling/edema: STAT if pt has developed SOB within 24 hours  If swelling, where is the swelling located? Left and right ankle, left is worse   How much weight have you gained and in what time span? About 5-10 pounds in a month   Have you gained 2 pounds in a day or 5 pounds in a week? Not sure   Do you have a log of your daily weights (if so, list)? No, log with home health nurse   Are you currently taking a fluid pill? No   Are you currently SOB? Sometimes, but not as bad as before   Have you traveled recently in a car or plane for an extended period of time?   Pt rescheduled again to sooner date, 1/16 with K. West.

## 2024-11-21 ENCOUNTER — Encounter: Payer: Self-pay | Admitting: Cardiology

## 2024-11-21 ENCOUNTER — Ambulatory Visit: Attending: Cardiology | Admitting: Cardiology

## 2024-11-21 VITALS — BP 106/60 | HR 86 | Ht 65.0 in | Wt 133.6 lb

## 2024-11-21 DIAGNOSIS — I272 Pulmonary hypertension, unspecified: Secondary | ICD-10-CM | POA: Diagnosis not present

## 2024-11-21 DIAGNOSIS — Z720 Tobacco use: Secondary | ICD-10-CM | POA: Diagnosis not present

## 2024-11-21 DIAGNOSIS — I4892 Unspecified atrial flutter: Secondary | ICD-10-CM

## 2024-11-21 DIAGNOSIS — E782 Mixed hyperlipidemia: Secondary | ICD-10-CM

## 2024-11-21 DIAGNOSIS — I2510 Atherosclerotic heart disease of native coronary artery without angina pectoris: Secondary | ICD-10-CM | POA: Diagnosis not present

## 2024-11-21 DIAGNOSIS — Z79899 Other long term (current) drug therapy: Secondary | ICD-10-CM

## 2024-11-21 MED ORDER — ROSUVASTATIN CALCIUM 20 MG PO TABS: 20.0000 mg | ORAL_TABLET | Freq: Every day | ORAL | 3 refills | Status: AC

## 2024-11-21 MED ORDER — METOPROLOL SUCCINATE ER 25 MG PO TB24: 25.0000 mg | ORAL_TABLET | Freq: Every day | ORAL | 3 refills | Status: DC

## 2024-11-21 NOTE — Patient Instructions (Signed)
 Medication Instructions:  Your physician recommends that you continue on your current medications as directed. Please refer to the Current Medication list given to you today.  *If you need a refill on your cardiac medications before your next appointment, please call your pharmacy*  Lab Work: TODAY: CMET, CBC  If you have labs (blood work) drawn today and your tests are completely normal, you will receive your results only by: MyChart Message (if you have MyChart) OR A paper copy in the mail If you have any lab test that is abnormal or we need to change your treatment, we will call you to review the results.  Testing/Procedures: NONE  Follow-Up: At [facility], you and your health needs are our priority.  As part of our continuing mission to provide you with exceptional heart care, our providers are all part of one team.  This team includes your primary Cardiologist (physician) and Advanced Practice Providers or APPs (Physician Assistants and Nurse Practitioners) who all work together to provide you with the care you need, when you need it.  Your next appointment:   8-10 week(s)  Provider:   Vinie JAYSON Maxcy, MD or Katlyn West, NP

## 2024-11-22 LAB — CBC
Hematocrit: 46.4 % (ref 34.0–46.6)
Hemoglobin: 14.6 g/dL (ref 11.1–15.9)
MCH: 30.4 pg (ref 26.6–33.0)
MCHC: 31.5 g/dL (ref 31.5–35.7)
MCV: 97 fL (ref 79–97)
Platelets: 293 x10E3/uL (ref 150–450)
RBC: 4.81 x10E6/uL (ref 3.77–5.28)
RDW: 12 % (ref 11.7–15.4)
WBC: 8.8 x10E3/uL (ref 3.4–10.8)

## 2024-11-22 LAB — COMPREHENSIVE METABOLIC PANEL WITH GFR
ALT: 26 IU/L (ref 0–32)
AST: 21 IU/L (ref 0–40)
Albumin: 4 g/dL (ref 3.8–4.8)
Alkaline Phosphatase: 78 IU/L (ref 49–135)
BUN/Creatinine Ratio: 12 (ref 12–28)
BUN: 9 mg/dL (ref 8–27)
Bilirubin Total: 0.5 mg/dL (ref 0.0–1.2)
CO2: 31 mmol/L — ABNORMAL HIGH (ref 20–29)
Calcium: 9.3 mg/dL (ref 8.7–10.3)
Chloride: 100 mmol/L (ref 96–106)
Creatinine, Ser: 0.74 mg/dL (ref 0.57–1.00)
Globulin, Total: 2.3 g/dL (ref 1.5–4.5)
Glucose: 74 mg/dL (ref 70–99)
Potassium: 5 mmol/L (ref 3.5–5.2)
Sodium: 143 mmol/L (ref 134–144)
Total Protein: 6.3 g/dL (ref 6.0–8.5)
eGFR: 86 mL/min/1.73

## 2024-11-24 ENCOUNTER — Ambulatory Visit: Payer: Self-pay | Admitting: Cardiology

## 2024-11-25 ENCOUNTER — Other Ambulatory Visit: Payer: Self-pay

## 2024-11-25 ENCOUNTER — Inpatient Hospital Stay (HOSPITAL_COMMUNITY): Admitting: Anesthesiology

## 2024-11-25 ENCOUNTER — Encounter (HOSPITAL_COMMUNITY): Payer: Self-pay | Admitting: Cardiology

## 2024-11-25 ENCOUNTER — Observation Stay (HOSPITAL_COMMUNITY)
Admission: EM | Admit: 2024-11-25 | Discharge: 2024-11-25 | Disposition: A | Discharge status: Home / Self Care | Attending: Internal Medicine | Admitting: Internal Medicine

## 2024-11-25 ENCOUNTER — Emergency Department (HOSPITAL_COMMUNITY)

## 2024-11-25 ENCOUNTER — Encounter (HOSPITAL_COMMUNITY)
Admission: EM | Disposition: A | Discharge status: Home / Self Care | Payer: Self-pay | Source: Home / Self Care | Attending: Emergency Medicine

## 2024-11-25 DIAGNOSIS — I10 Essential (primary) hypertension: Secondary | ICD-10-CM | POA: Diagnosis not present

## 2024-11-25 DIAGNOSIS — I4892 Unspecified atrial flutter: Secondary | ICD-10-CM | POA: Diagnosis not present

## 2024-11-25 DIAGNOSIS — Z87891 Personal history of nicotine dependence: Secondary | ICD-10-CM | POA: Insufficient documentation

## 2024-11-25 DIAGNOSIS — J449 Chronic obstructive pulmonary disease, unspecified: Secondary | ICD-10-CM

## 2024-11-25 DIAGNOSIS — Z7901 Long term (current) use of anticoagulants: Secondary | ICD-10-CM | POA: Insufficient documentation

## 2024-11-25 DIAGNOSIS — E785 Hyperlipidemia, unspecified: Secondary | ICD-10-CM | POA: Diagnosis not present

## 2024-11-25 DIAGNOSIS — Z79899 Other long term (current) drug therapy: Secondary | ICD-10-CM | POA: Diagnosis not present

## 2024-11-25 DIAGNOSIS — I483 Typical atrial flutter: Secondary | ICD-10-CM

## 2024-11-25 DIAGNOSIS — R002 Palpitations: Secondary | ICD-10-CM | POA: Diagnosis present

## 2024-11-25 HISTORY — PX: CARDIOVERSION: EP1203

## 2024-11-25 LAB — CBC WITH DIFFERENTIAL/PLATELET
Abs Immature Granulocytes: 0.06 K/uL (ref 0.00–0.07)
Basophils Absolute: 0.1 K/uL (ref 0.0–0.1)
Basophils Relative: 1 %
Eosinophils Absolute: 0.2 K/uL (ref 0.0–0.5)
Eosinophils Relative: 2 %
HCT: 47.5 % — ABNORMAL HIGH (ref 36.0–46.0)
Hemoglobin: 14.9 g/dL (ref 12.0–15.0)
Immature Granulocytes: 1 %
Lymphocytes Relative: 28 %
Lymphs Abs: 2.7 K/uL (ref 0.7–4.0)
MCH: 30.8 pg (ref 26.0–34.0)
MCHC: 31.4 g/dL (ref 30.0–36.0)
MCV: 98.1 fL (ref 80.0–100.0)
Monocytes Absolute: 0.6 K/uL (ref 0.1–1.0)
Monocytes Relative: 6 %
Neutro Abs: 6 K/uL (ref 1.7–7.7)
Neutrophils Relative %: 62 %
Platelets: 228 K/uL (ref 150–400)
RBC: 4.84 MIL/uL (ref 3.87–5.11)
RDW: 13 % (ref 11.5–15.5)
WBC: 9.5 K/uL (ref 4.0–10.5)
nRBC: 0.2 % (ref 0.0–0.2)

## 2024-11-25 LAB — PROTIME-INR
INR: 1.2 (ref 0.8–1.2)
Prothrombin Time: 16.1 s — ABNORMAL HIGH (ref 11.4–15.2)

## 2024-11-25 MED ORDER — ROSUVASTATIN CALCIUM 20 MG PO TABS
20.0000 mg | ORAL_TABLET | Freq: Every day | ORAL | Status: DC
  Administered 2024-11-25: 20 mg via ORAL
  Filled 2024-11-25: qty 1

## 2024-11-25 MED ORDER — ONDANSETRON HCL 4 MG/2ML IJ SOLN: 4.0000 mg | Freq: Four times a day (QID) | INTRAMUSCULAR | Status: DC | PRN

## 2024-11-25 MED ORDER — ACETAMINOPHEN 325 MG PO TABS: 650.0000 mg | ORAL_TABLET | ORAL | Status: DC | PRN

## 2024-11-25 MED ORDER — METOPROLOL SUCCINATE ER 50 MG PO TB24: 50.0000 mg | ORAL_TABLET | Freq: Every day | ORAL | 0 refills | Status: AC

## 2024-11-25 MED ORDER — METOPROLOL SUCCINATE ER 25 MG PO TB24
25.0000 mg | ORAL_TABLET | Freq: Every day | ORAL | Status: DC
  Administered 2024-11-25: 25 mg via ORAL
  Filled 2024-11-25: qty 1

## 2024-11-25 MED ORDER — PROPOFOL 10 MG/ML IV BOLUS
INTRAVENOUS | Status: DC | PRN
  Administered 2024-11-25: 40 mg via INTRAVENOUS
  Administered 2024-11-25: 20 mg via INTRAVENOUS

## 2024-11-25 MED ORDER — SODIUM CHLORIDE 0.9 % IV SOLN: INTRAVENOUS | Status: DC

## 2024-11-25 MED ORDER — APIXABAN 5 MG PO TABS
5.0000 mg | ORAL_TABLET | Freq: Two times a day (BID) | ORAL | Status: DC
  Administered 2024-11-25: 5 mg via ORAL
  Filled 2024-11-25: qty 1

## 2024-11-25 MED ORDER — LIDOCAINE 2% (20 MG/ML) 5 ML SYRINGE
INTRAMUSCULAR | Status: DC | PRN
  Administered 2024-11-25: 60 mg via INTRAVENOUS

## 2024-11-25 NOTE — Anesthesia Postprocedure Evaluation (Addendum)
"   Anesthesia Post Note  Patient: Brittany Gomez  Procedure(s) Performed: CARDIOVERSION     Patient location during evaluation: Cath Lab Anesthesia Type: General Level of consciousness: patient cooperative and awake and alert Pain management: pain level controlled Vital Signs Assessment: post-procedure vital signs reviewed and stable Respiratory status: spontaneous breathing Cardiovascular status: stable Anesthetic complications: no   No notable events documented.  Last Vitals:  Vitals:   11/25/24 1100 11/25/24 1105  BP: 124/83 122/83  Pulse: 71 73  Resp: 15 (!) 21  Temp:    SpO2: 94% (!) 89%    Last Pain:  Vitals:   11/25/24 1050  TempSrc: Oral  PainSc: 0-No pain                 [name]      "

## 2024-11-25 NOTE — ED Triage Notes (Signed)
 Patient states her smart watch alerted her to a change in heart rate around 0030. Patient called EMS when heart rate did not slow down. Patient denies chest pain at this time. Patient does state she is feeling short of breath.

## 2024-11-25 NOTE — Care Management Obs Status (Signed)
 MEDICARE OBSERVATION STATUS NOTIFICATION   Patient Details  Name: Brittany Gomez MRN: [id number] Date of Birth: [birth year]   Medicare Observation Status Notification Given:  Yes    Nena LITTIE Coffee, RN 11/25/2024, 11:08 AM

## 2024-11-25 NOTE — Transfer of Care (Signed)
 Immediate Anesthesia Transfer of Care Note  Patient: Brittany Gomez  Procedure(s) Performed: CARDIOVERSION  Patient Location: PACU  Anesthesia Type:General  Level of Consciousness: drowsy  Airway & Oxygen Therapy: Patient Spontanous Breathing and Patient connected to nasal cannula oxygen  Post-op Assessment: Report given to RN and Post -op Vital signs reviewed and stable  Post vital signs: Reviewed and stable  Last Vitals:  Vitals Value Taken Time  BP    Temp    Pulse    Resp    SpO2      Last Pain:  Vitals:   11/25/24 1005  TempSrc: Temporal  PainSc:          Complications: No notable events documented.

## 2024-11-25 NOTE — Interval H&P Note (Signed)
 History and Physical Interval Note:  11/25/2024 10:30 AM  Brittany Gomez  has presented today for surgery, with the diagnosis of aflutter.  The various methods of treatment have been discussed with the patient and family. After consideration of risks, benefits and other options for treatment, the patient has consented to  Procedures: CARDIOVERSION (N/A) as a surgical intervention.  The patient's history has been reviewed, patient examined, no change in status, stable for surgery.  I have reviewed the patient's chart and labs.  Questions were answered to the patient's satisfaction.     Emeline FORBES Calender

## 2024-11-25 NOTE — Discharge Instructions (Signed)
 You underwent a cardioversion for your atrial flutter with successful conversion to normal sinus rhythm If you feel palpitations again please do not hesitate to contact the office or come back to the emergency department Increase your metoprolol  succinate to 50 mg once daily Continue Eliquis  5 mg twice daily and do not skip any doses We will arrange for follow-up with electrophysiology

## 2024-11-25 NOTE — H&P (View-Only) (Signed)
 "   DAILY PROGRESS NOTE   Patient Name: Brittany Gomez Date of Encounter: 11/25/2024 Cardiologist: Vinie JAYSON Maxcy, MD  Chief Complaint   Tired  Patient Profile   Brittany Gomez is a 74 y.o. female with primary hypertension, hypercholesterolemia, tobacco use, lung nodule, emphysema and atrial flutter who is being seen 11/25/2024 for the evaluation of atrial flutter.   Subjective   Brittany Gomez remains in atrial flutter with 2:1 conduction -rates in the 130's. She has been compliant with Eliquis , no missed doses. Defib pads in place.  Objective   Vitals:   11/25/24 0700 11/25/24 0730 11/25/24 0745 11/25/24 0905  BP: (!) 115/98  110/89 (!) 133/109  Pulse: (!) 133  (!) 131 (!) 133  Resp: (!) 21  20   Temp:  98.5 F (36.9 C)    TempSrc:  Oral    SpO2: 90%  91%     Intake/Output Summary (Last 24 hours) at 11/25/2024 9081 Last data filed at 11/25/2024 0309 Gross per 24 hour  Intake 750 ml  Output --  Net 750 ml   There were no vitals filed for this visit.  Physical Exam   General appearance: alert and no distress Lungs: clear to auscultation bilaterally Heart: regular tachycardia Extremities: extremities normal, atraumatic, no cyanosis or edema Neurologic: Grossly normal  Inpatient Medications    Scheduled Meds:  apixaban   5 mg Oral BID   metoprolol  succinate  25 mg Oral Daily   rosuvastatin   20 mg Oral Daily    Continuous Infusions:   PRN Meds: acetaminophen , ondansetron  (ZOFRAN ) IV   Labs   Results for orders placed or performed during the hospital encounter of 11/25/24 (from the past 48 hours)  CBC with Differential     Status: Abnormal   Collection Time: 11/25/24  3:23 AM  Result Value Ref Range   WBC 9.5 4.0 - 10.5 K/uL   RBC 4.84 3.87 - 5.11 MIL/uL   Hemoglobin 14.9 12.0 - 15.0 g/dL   HCT 52.4 (H) 63.9 - 53.9 %   MCV 98.1 80.0 - 100.0 fL   MCH 30.8 26.0 - 34.0 pg   MCHC 31.4 30.0 - 36.0 g/dL   RDW 86.9 88.4 - 84.4 %   Platelets 228 150 -  400 K/uL   nRBC 0.2 0.0 - 0.2 %   Neutrophils Relative % 62 %   Neutro Abs 6.0 1.7 - 7.7 K/uL   Lymphocytes Relative 28 %   Lymphs Abs 2.7 0.7 - 4.0 K/uL   Monocytes Relative 6 %   Monocytes Absolute 0.6 0.1 - 1.0 K/uL   Eosinophils Relative 2 %   Eosinophils Absolute 0.2 0.0 - 0.5 K/uL   Basophils Relative 1 %   Basophils Absolute 0.1 0.0 - 0.1 K/uL   Immature Granulocytes 1 %   Abs Immature Granulocytes 0.06 0.00 - 0.07 K/uL    Comment: Performed at [facility], [address], [place], [place] [address]  Protime-INR     Status: Abnormal   Collection Time: 11/25/24  3:23 AM  Result Value Ref Range   Prothrombin Time 16.1 (H) 11.4 - 15.2 seconds   INR 1.2 0.8 - 1.2    Comment: (NOTE) INR goal varies based on device and disease states. Performed at [facility], [address], [place], [place] [address]     ECG   Typical atrial flutter at 130 - Personally Reviewed  Telemetry   Typical atrial flutter at 135 - Personally Reviewed  Radiology    DG Chest Port 1 View Result Date: 11/25/2024 EXAM: 1 VIEW(S) XRAY OF THE CHEST 11/25/2024 03:52:00 AM COMPARISON: 10/28/2024 CLINICAL HISTORY: atrial flutter FINDINGS: LINES, TUBES AND DEVICES: Defibrillator pads noted. LUNGS AND PLEURA: Increased interstitial markings, favoring emphysematous changes. No focal pulmonary opacity. No pleural effusion. No pneumothorax. HEART AND MEDIASTINUM: Cardiomegaly. No acute abnormality of the mediastinal silhouette. BONES AND SOFT TISSUES: No acute osseous abnormality. IMPRESSION: 1. Cardiomegaly. 2. Emphysema (ICD10-J43.9). Electronically signed by: Pinkie Pebbles MD 11/25/2024 03:59 AM EST RP Workstation: [id number]    Cardiac Studies   N/A  Assessment   Principal Problem:   Atrial flutter [facility])   Plan   Brittany Gomez presented with symptomatic atrial flutter. She had previous DCCV in December and has been compliant with Eliquis . I advised repeat DCCV today. She has been NPO  p MN. D/w nurse who will give am Eliquis  and metoprolol . Plan for DCCV in endoscopy today. May be discharged after. I have asked cardiac EP to see in consult for possible flutter ablation, however, they said they would not be able to accomplish that today.  Informed Consent   Shared Decision Making/Informed Consent The risks (stroke, cardiac arrhythmias rarely resulting in the need for a temporary or permanent pacemaker, skin irritation or burns and complications associated with conscious sedation including aspiration, arrhythmia, respiratory failure and death), benefits (restoration of normal sinus rhythm) and alternatives of a direct current cardioversion were explained in detail to Brittany Gomez and she agrees to proceed.       Time Spent Directly with Patient:  I have spent a total of 25 minutes with the patient reviewing hospital notes, telemetry, EKGs, labs and examining the patient as well as establishing an assessment and plan that was discussed personally with the patient.  > 50% of time was spent in direct patient care.  Length of Stay:  LOS: 0 days   Vinie KYM Maxcy, MD, [facility], FNLA, FACP  [place]  [facility]  Medical Director of the [facility]  [facility] Diplomate of the American Board of Clinical Lipidology Attending Cardiologist  Direct Dial: [phone]  Fax: [phone]  Website:  [email or web address]  Vinie BROCKS Georgios Kina 11/25/2024, 9:18 AM   "

## 2024-11-25 NOTE — Anesthesia Preprocedure Evaluation (Addendum)
 "                                  Anesthesia Evaluation  Patient identified by MRN, date of birth, ID band Patient awake    Reviewed: Allergy & Precautions, H&P , NPO status , Patient's Chart, lab work & pertinent test results  Airway Mallampati: III  TM Distance: >3 FB Neck ROM: Full    Dental  (+) Edentulous Upper, Edentulous Lower, Dental Advisory Given   Pulmonary COPD, former smoker   Pulmonary exam normal breath sounds clear to auscultation       Cardiovascular hypertension, Pt. on medications pulmonary hypertension+ dysrhythmias Atrial Fibrillation + Valvular Problems/Murmurs (Mod TR)  Rhythm:Irregular Rate:Tachycardia  Echo 10/2024  1. Left ventricular ejection fraction, by estimation, is 60 to 65%. The left  ventricle has normal function. The left ventricle has no regional wall  motion abnormalities. Left ventricular diastolic parameters are indeterminate.  There is the interventricular septum is flattened in systole and diastole,  consistent with right ventricular pressure and volume overload.   2. Right ventricular systolic function is moderately reduced. The right  ventricular size is severely enlarged. There is severely elevated  pulmonary artery systolic pressure.   3. Left atrial size was severely dilated.   4. Right atrial size was severely dilated.   5. The mitral valve is normal in structure. No evidence of mitral valve  regurgitation. No evidence of mitral stenosis.   6. The tricuspid valve is abnormal. Tricuspid valve regurgitation is moderate.   7. The aortic valve has an indeterminant number of cusps. There is mild  calcification of the aortic valve. There is mild thickening of the aortic valve.  Aortic valve regurgitation is not visualized. No aortic stenosis is present.   8. The inferior vena cava is dilated in size with <50% respiratory  variability, suggesting right atrial pressure of 15 mmHg.   9. Agitated saline contrast bubble study  was negative, with no evidence  of any interatrial shunt.    Echo  10/2024  1. EF hard to assess given significant tachycardia.   2. Right ventricular systolic function is moderately reduced. The right  ventricular size is moderately enlarged. There is severely elevated  pulmonary artery systolic pressure. The estimated right ventricular  systolic pressure is 79.6 mmHg.   3. There is evidence of irregular layering in LAA suggestive of pectinate muscle,  but clot cannot be ruled out. The LAA emptying velocity was 60 cm/s.   4. Right atrial size was severely dilated.   5. The mitral valve is normal in structure. No evidence of mitral valve regurgitation.   6. Tricuspid valve regurgitation is moderate to severe.   7. The aortic valve is normal in structure. Aortic valve regurgitation is  not visualized.   8. The inferior vena cava is dilated in size with <50% respiratory variability,  suggesting right atrial pressure of 15 mmHg.   9. Agitated saline contrast bubble study was negative, with no evidence  of any interatrial shunt.     Neuro/Psych   Anxiety     negative neurological ROS     GI/Hepatic negative GI ROS, Neg liver ROS,neg GERD  ,,  Endo/Other  negative endocrine ROS    Renal/GU negative Renal ROS     Musculoskeletal  (+) Arthritis , Osteoarthritis,    Abdominal   Peds  Hematology negative hematology ROS (+)   Anesthesia Other Findings  Reproductive/Obstetrics                              Anesthesia Physical Anesthesia Plan  ASA: 4  Anesthesia Plan: General   Post-op Pain Management: Minimal or no pain anticipated   Induction: Intravenous  PONV Risk Score and Plan: 3 and Propofol  infusion, Treatment may vary due to age or medical condition and TIVA  Airway Management Planned: Natural Airway and Nasal Cannula  Additional Equipment:   Intra-op Plan:   Post-operative Plan:   Informed Consent: I have reviewed the patients  History and Physical, chart, labs and discussed the procedure including the risks, benefits and alternatives for the proposed anesthesia with the patient or authorized representative who has indicated his/her understanding and acceptance.     Dental advisory given  Plan Discussed with: CRNA  Anesthesia Plan Comments:          Anesthesia Quick Evaluation  "

## 2024-11-25 NOTE — ED Provider Notes (Signed)
 " [place] EMERGENCY DEPARTMENT AT [facility] Provider Note   CSN: [id number] Arrival date & time: 11/25/24  9691     Patient presents with: Palpitations   Brittany Gomez is a 74 y.o. female.   The history is provided by the patient and medical records.  Palpitations  74 year old female with history of hypertension, anxiety, arthritis, newly diagnosed atrial flutter last month, presenting to the ED with palpitations.  States her watch woke her from sleep around 12:30 AM due to high heart rate.  Heart rate 150s to 170s on arrival, appears consistent with atrial flutter.  She is adamant she has been compliant with her medications including her metoprolol .  Denies any excessive caffeine  intake.  Last dose of Xarelto yesterday evening before bed.  Denies any chest pain.  Prior to Admission medications  Medication Sig Start Date End Date Taking? Authorizing Provider  apixaban  (ELIQUIS ) 5 MG TABS tablet Take 1 tablet (5 mg total) by mouth 2 (two) times daily. First month supply, then refills from [facility] 10/31/24   Meng, Hao, [place]  apixaban  (ELIQUIS ) 5 MG TABS tablet Take 1 tablet (5 mg total) by mouth 2 (two) times daily. To start 12/01/24 (after 1 mo supply from [place] ends) 10/31/24   Meng, Hao, PA  clonazePAM (KLONOPIN) 1 MG tablet Take 1 mg by mouth 2 (two) times daily as needed for anxiety. Patient not taking: Reported on 11/21/2024    [provider]  levocetirizine (XYZAL) 5 MG tablet Take 5 mg by mouth daily as needed for allergies. Patient not taking: Reported on 11/21/2024    [provider]  metoprolol  succinate (TOPROL -XL) 25 MG 24 hr tablet Take 1 tablet (25 mg total) by mouth daily. 11/21/24   West, Katlyn D, NP  mirtazapine  (REMERON ) 30 MG tablet Take 15-30 mg by mouth at bedtime as needed (sleep). Patient not taking: Reported on 11/21/2024    [provider]  rosuvastatin  (CRESTOR ) 20 MG tablet Take 1 tablet (20 mg total) by mouth daily.  11/21/24   West, Katlyn D, NP  tiZANidine (ZANAFLEX) 4 MG tablet Take 4 mg by mouth 2 (two) times daily as needed for muscle spasms. Patient not taking: Reported on 11/21/2024    [provider]    Allergies: Mobic  [meloxicam ]    Review of Systems  Cardiovascular:  Positive for palpitations.  All other systems reviewed and are negative.   Updated Vital Signs BP (!) 121/92   Pulse (!) 126   Temp (!) 97.5 F (36.4 C) (Oral)   Resp 19   LMP 11/06/1998   SpO2 96%   Physical Exam Vitals and nursing note reviewed.  Constitutional:      Appearance: She is well-developed.  HENT:     Head: Normocephalic and atraumatic.  Eyes:     Conjunctiva/sclera: Conjunctivae normal.     Pupils: Pupils are equal, round, and reactive to light.  Cardiovascular:     Rate and Rhythm: Tachycardia present.     Heart sounds: Normal heart sounds.     Comments: Atrial flutter, rate 150's -170's Pulmonary:     Effort: Pulmonary effort is normal.     Breath sounds: Normal breath sounds.  Abdominal:     General: Bowel sounds are normal.     Palpations: Abdomen is soft.  Musculoskeletal:        General: Normal range of motion.     Cervical back: Normal range of motion.  Skin:    General: Skin is warm  and dry.  Neurological:     Mental Status: She is alert and oriented to person, place, and time.     (all labs ordered are listed, but only abnormal results are displayed) Labs Reviewed  CBC WITH DIFFERENTIAL/PLATELET - Abnormal; Notable for the following components:      Result Value   HCT 47.5 (*)    All other components within normal limits  PROTIME-INR - Abnormal; Notable for the following components:   Prothrombin Time 16.1 (*)    All other components within normal limits  BASIC METABOLIC PANEL WITH GFR  MAGNESIUM    EKG: None  Radiology: [facility] Chest Port 1 View Result Date: 11/25/2024 EXAM: 1 VIEW(S) XRAY OF THE CHEST 11/25/2024 03:52:00 AM COMPARISON: 10/28/2024 CLINICAL  HISTORY: atrial flutter FINDINGS: LINES, TUBES AND DEVICES: Defibrillator pads noted. LUNGS AND PLEURA: Increased interstitial markings, favoring emphysematous changes. No focal pulmonary opacity. No pleural effusion. No pneumothorax. HEART AND MEDIASTINUM: Cardiomegaly. No acute abnormality of the mediastinal silhouette. BONES AND SOFT TISSUES: No acute osseous abnormality. IMPRESSION: 1. Cardiomegaly. 2. Emphysema (ICD10-J43.9). Electronically signed by: Pinkie Pebbles MD 11/25/2024 03:59 AM EST RP Workstation: [id number]     Procedures   CRITICAL CARE Performed by: Olam CHRISTELLA [place]   Total critical care time: 45 minutes  Critical care time was exclusive of separately billable procedures and treating other patients.  Critical care was necessary to treat or prevent imminent or life-threatening deterioration.  Critical care was time spent personally by me on the following activities: development of treatment plan with patient and/or surrogate as well as nursing, discussions with consultants, evaluation of patient's response to treatment, examination of patient, obtaining history from patient or surrogate, ordering and performing treatments and interventions, ordering and review of laboratory studies, ordering and review of radiographic studies, pulse oximetry and re-evaluation of patient's condition.   Medications Ordered in the ED - No data to display                                  Medical Decision Making Amount and/or Complexity of Data Reviewed Labs: ordered. Radiology: ordered and independent interpretation performed. ECG/medicine tests: ordered and independent interpretation performed.  Risk Decision regarding hospitalization.   74 year old female here with recurrent atrial flutter.  Newly diagnosed with this last month.  Symptoms today almost identical to initial presentation.  She is hemodynamically stable, rates 150s to 170s during my exam.  She has been compliant with all  of her medications.  Denies any heavy caffeine  use.  Recent thyroid  testing also WNL during last hospitalization.  Labs sent.  CXR with cardiomegaly, emphysema but no acute findings noted.  4:00 AM Spoke with cardiology, Dr. Melia who has evaluated patient.  As she is HD stable, will leave in flutter for now, plan for possible ablation later today with EP.  He will admit to cardiology service.  Labs are pending at time of admission.  Final diagnoses:  Atrial flutter, unspecified type [facility]    ED Discharge Orders     None          [place] Olam CHRISTELLA, PA-C 11/25/24 0454    [place] Lonni PARAS, MD 11/25/24 2307  "

## 2024-11-25 NOTE — CV Procedure (Signed)
 Post Cardioversion Procedure Note  Procedure: Electrical Cardioversion Indications:  Atrial Flutter  Procedure Details:  Consent: Risks of procedure as well as the alternatives and risks of each were explained to the (patient/caregiver).  Consent for procedure obtained. Patient has not skipped any doses of Eliquis  5 mg BID.  Time Out: Verified patient identification, verified procedure, site/side was marked, verified correct patient position, special equipment/implants available, medications/allergies/relevent history reviewed, required imaging and test results available.  Performed  Patient placed on cardiac monitor, pulse oximetry, supplemental oxygen as necessary.  Sedation given by anesthesia team.  Pacer pads placed anterior and posterior chest.  Cardioverted 1 time(s).  Cardioversion with synchronized biphasic 200J shock.  Evaluation: Findings: Post procedure EKG shows: NSR Complications: None Patient did tolerate procedure well.  Time Spent Directly with the Patient:  27 minutes   Brittany Gomez Calender 11/25/2024, 10:48 AM

## 2024-11-25 NOTE — H&P (Signed)
 "   Cardiology Admission History and Physical   Patient ID: Brittany Gomez MRN: [id number]; DOB: 08-23-1951   Admission date: 11/25/2024  PCP:  Arloa Elsie SAUNDERS, MD   [facility] Providers Cardiologist:  Vinie JAYSON Maxcy, MD        Chief Complaint:  atrial flutter  Patient Profile:   Brittany Gomez is a 74 y.o. female with primary hypertension, hypercholesterolemia, tobacco use, lung nodule, emphysema and atrial flutter who is being seen 11/25/2024 for the evaluation of atrial flutter.  History of Present Illness:   Ms. Sanko states that she was in her USOH, went to bed last night. Then was woken up by her Apple Watch by a high heart rate alert. When she woke up, she was able to appreciate palpitations just like the last time she was here for atrial flutter in 10/2024. She waited to see if the sensation would improve on its own but unfortunately it did not so she presented to the ED for evaluation.  In the ED, she was in atrial flutter at 130. BP 120s/90s. She feels palpitations, but otherwise denies SOB, CP, lightheadedness, dizziness, presyncope, true syncope. She denies any missed doses of her home metoprolol  or eliquis  since it was prescribed last month.   Was recently admitted 10/28/24-10/31/24 for a similar presentation, that was when she was first diagnosed with atrial flutter, rate control was limited by hypotension and ultimately underwent a TEE/DCCV on 10/29/24. 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  She denies any history of cardiac surgery in the past and denies ever being diagnosed with atrial fibrillation in the past.   Past Medical History:  Diagnosis Date   Allergy    Anxiety     Arthritis    Hypertension    Plantar fasciitis    STD (sexually transmitted disease)    treated for chlamydia in her 20's   Urinary incontinence     Past Surgical History:  Procedure Laterality Date   CARDIOVERSION N/A 10/29/2024   Procedure: CARDIOVERSION;  Surgeon: Ren Donley Joelle VEAR, MD;  Location: [facility]VASIVE CV [facility];  Service: Cardiovascular;  Laterality: N/A;   CESAREAN SECTION     TRANSESOPHAGEAL ECHOCARDIOGRAM (CATH LAB) N/A 10/29/2024   Procedure: TRANSESOPHAGEAL ECHOCARDIOGRAM;  Surgeon: Ren Donley Joelle VEAR, MD;  Location: [facility]VAS[facility] CV [facility];  Service: Cardiovascular;  Laterality: N/A;     Medications Prior to Admission: Prior to Admission medications  Medication Sig Start Date End Date Taking? Authorizing Provider  apixaban  (ELIQUIS ) 5 MG TABS tablet Take 1 tablet (5 mg total) by mouth 2 (two) times daily. First month supply, then refills from [facility] 10/31/24   Meng, Hao, [place]  apixaban  (ELIQUIS ) 5 MG TABS tablet Take 1 tablet (5 mg total) by mouth 2 (two) times daily. To start 12/01/24 (after 1 mo supply from [facility] ends) 10/31/24   Meng, Hao, PA  clonazePAM (KLONOPIN) 1 MG tablet Take 1 mg by mouth 2 (two) times daily as needed for anxiety. Patient not taking: Reported on 11/21/2024    [provider]  levocetirizine (XYZAL) 5 MG tablet Take 5 mg by mouth daily as needed for allergies. Patient not taking: Reported on 11/21/2024    [provider]  metoprolol  succinate (TOPROL -XL) 25 MG 24  hr tablet Take 1 tablet (25 mg total) by mouth daily. 11/21/24   West, Katlyn D, NP  mirtazapine  (REMERON ) 30 MG tablet Take 15-30 mg by mouth at bedtime as needed (sleep). Patient not taking: Reported on 11/21/2024    [provider]  rosuvastatin  (CRESTOR ) 20 MG tablet Take 1 tablet (20 mg total) by mouth daily. 11/21/24   West, Katlyn D, NP  tiZANidine (ZANAFLEX) 4 MG tablet Take 4 mg by mouth 2 (two) times daily as needed for muscle spasms. Patient  not taking: Reported on 11/21/2024    [provider]     Allergies:   Allergies[1]  Social History:   Social History   Socioeconomic History   Marital status: Married    Spouse name: Not on file   Number of children: Not on file   Years of education: Not on file   Highest education level: Not on file  Occupational History   Not on file  Tobacco Use   Smoking status: Former    Current packs/day: 0.75    Average packs/day: 0.8 packs/day for 50.0 years (37.5 ttl pk-yrs)    Types: Cigarettes   Smokeless tobacco: Never  Vaping Use   Vaping status: Never Used  Substance and Sexual Activity   Alcohol use: No   Drug use: No   Sexual activity: Not Currently    Birth control/protection: Post-menopausal  Other Topics Concern   Not on file  Social History Narrative   Not on file   Social Drivers of Health   Tobacco Use: Medium Risk (11/21/2024)   Patient History    Smoking Tobacco Use: Former    Smokeless Tobacco Use: Never    Passive Exposure: Not on Actuary Strain: Not on file  Food Insecurity: Patient Declined (10/29/2024)   Epic    Worried About Programme Researcher, Broadcasting/film/video in the Last Year: Patient declined    Barista in the Last Year: Patient declined  Transportation Needs: Patient Declined (10/29/2024)   Epic    Lack of Transportation (Medical): Patient declined    Lack of Transportation (Non-Medical): Patient declined  Physical Activity: Not on file  Stress: Not on file  Social Connections: Patient Declined (10/29/2024)   Social Connection and Isolation Panel    Frequency of Communication with Friends and Family: Patient declined    Frequency of Social Gatherings with Friends and Family: Patient declined    Attends Religious Services: Patient declined    Active Member of Clubs or Organizations: Patient declined    Attends Banker Meetings: Patient declined    Marital Status: Patient declined  Intimate Partner Violence:  Patient Declined (10/29/2024)   Epic    Fear of Current or Ex-Partner: Patient declined    Emotionally Abused: Patient declined    Physically Abused: Patient declined    Sexually Abused: Patient declined  Depression (PHQ2-9): Not on file  Alcohol Screen: Not on file  Housing: Patient Declined (10/29/2024)   Epic    Unable to Pay for Housing in the Last Year: Patient declined    Number of Times Moved in the Last Year: Not on file    Homeless in the Last Year: Patient declined  Utilities: Patient Declined (10/29/2024)   Epic    Threatened with loss of utilities: Patient declined  Health Literacy: Not on file    Family History:   The patient's family history includes Breast cancer in her sister; Cancer in her sister; Diabetes in her mother  and sister; Hyperlipidemia in her sister; Hypertension in her brother, sister, and sister; Stroke in her father.    ROS:  Please see the history of present illness.  All other ROS reviewed and negative.     Physical Exam/Data:   Vitals:   11/25/24 0321  BP: (!) 121/92  Pulse: (!) 126  Resp: 19  Temp: (!) 97.5 F (36.4 C)  TempSrc: Oral  SpO2: 96%    Intake/Output Summary (Last 24 hours) at 11/25/2024 0343 Last data filed at 11/25/2024 0309 Gross per 24 hour  Intake 750 ml  Output --  Net 750 ml      11/21/2024    8:24 AM 10/29/2024    1:26 PM 10/28/2024    1:20 PM  Last 3 Weights  Weight (lbs) 133 lb 9.6 oz 125 lb 125 lb  Weight (kg) 60.601 kg 56.7 kg 56.7 kg     There is no height or weight on file to calculate BMI.  General:  in NAD HEENT: normal Cardiac:  tachycardiac, irregularly regular rhythm Lungs:  clear to auscultation bilaterally, no wheezing, rhonchi or rales  Abd: soft, nontender, no hepatomegaly  Ext: no edema Musculoskeletal:  No deformities, BUE and BLE strength normal and equal Skin: warm and dry  Neuro:  CNs 2-12 intact, no focal abnormalities noted Psych:  Normal affect   EKG:  The ECG that was done was  personally reviewed and demonstrates atrial flutter  Relevant CV Studies:  TTE 10/30/24: 1. Left ventricular ejection fraction, by estimation, is 60 to 65%. The  left ventricle has normal function. The left ventricle has no regional  wall motion abnormalities. Left ventricular diastolic parameters are  indeterminate. There is the  interventricular septum is flattened in systole and diastole, consistent  with right ventricular pressure and volume overload.   2. Right ventricular systolic function is moderately reduced. The right  ventricular size is severely enlarged. There is severely elevated  pulmonary artery systolic pressure.   3. Left atrial size was severely dilated.   4. Right atrial size was severely dilated.   5. The mitral valve is normal in structure. No evidence of mitral valve  regurgitation. No evidence of mitral stenosis.   6. The tricuspid valve is abnormal. Tricuspid valve regurgitation is  moderate.   7. The aortic valve has an indeterminant number of cusps. There is mild  calcification of the aortic valve. There is mild thickening of the aortic  valve. Aortic valve regurgitation is not visualized. No aortic stenosis is  present.   8. The inferior vena cava is dilated in size with <50% respiratory  variability, suggesting right atrial pressure of 15 mmHg.   9. Agitated saline contrast bubble study was negative, with no evidence  of any interatrial shunt.   Laboratory Data:  High Sensitivity Troponin:  No results for input(s): TROPONINIHS in the last 720 hours.    Chemistry Recent Labs  Lab 11/21/24 0941  NA 143  K 5.0  CL 100  CO2 31*  GLUCOSE 74  BUN 9  CREATININE 0.74  CALCIUM  9.3    Recent Labs  Lab 11/21/24 0941  PROT 6.3  ALBUMIN 4.0  AST 21  ALT 26  ALKPHOS 78  BILITOT 0.5   Lipids No results for input(s): CHOL, TRIG, HDL, LABVLDL, LDLCALC, CHOLHDL in the last 168 hours. Hematology Recent Labs  Lab 11/21/24 0941  WBC  8.8  RBC 4.81  HGB 14.6  HCT 46.4  MCV 97  MCH 30.4  MCHC  31.5  RDW 12.0  PLT 293   Thyroid  No results for input(s): TSH, FREET4 in the last 168 hours. BNPNo results for input(s): BNP, PROBNP in the last 168 hours.  DDimer No results for input(s): DDIMER in the last 168 hours.   Radiology/Studies:  No results found.   Assessment and Plan:   74 y.o. female with primary hypertension, hypercholesterolemia, tobacco use, lung nodule, emphysema and atrial flutter who is being seen 11/25/2024 for the evaluation of atrial flutter.  CTI-dependent atrial flutter Presents for 2nd episode of CTI-dependent atrial flutter, newly diagnosed back in 10/2024. Previous attempts at rate control have been limited by hypotension. While she has moderate RV dysfunction with elevated PASP, I do not see prior evidence of atrial fibrillation or need to go transseptal. Given this, and he difficult to pharmacologically control atrial flutter, could be worth a discussion with EP to see if she could be a candidate for CTI ablation (purely right-sided). Will leave her in atrial flutter given HD stable, relatively asymptomatic while in bed, and potential for add-on EP ablation. No missed doses of her home apixaban . Last TEE 10/29/24 without any thrombus. 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 TTE after TEE/DCCV showing normal LV function, moderate RV dysfunction, PASAP (measured from her TEE), with septal flattening in systole and diastole. 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 Does not appear to have been done yet.  HLD - continue home rosuvastatin  20mg  every day  HTN - on metoprolol  as  above  Prior tobacco use, recently quit  Inpatient Bundle - Code: FULL - Access: PIVs - Diet: NPO in case add-on procedure can be performed - DVT ppx: home AC - GI ppx: not indicated - Bowel: miralax PRN - Dispo: pending resolution of her atrial flutter  Risk Assessment/Risk Scores:     CHA2DS2-VASc Score = 3   This indicates a 3.2% annual risk of stroke. The patient's score is based upon: CHF History: 0 HTN History: 1 Diabetes History: 0 Stroke History: 0 Vascular Disease History: 0 Age Score: 1 Gender Score: 1      Severity of Illness: The appropriate patient status for this patient is INPATIENT. Inpatient status is judged to be reasonable and necessary in order to provide the required intensity of service to ensure the patient's safety. The patient's presenting symptoms, physical exam findings, and initial radiographic and laboratory data in the context of their chronic comorbidities is felt to place them at high risk for further clinical deterioration. Furthermore, it is not anticipated that the patient will be medically stable for discharge from the hospital within 2 midnights of admission.   * I certify that at the point of admission it is my clinical judgment that the patient will require inpatient hospital care spanning beyond 2 midnights from the point of admission due to high intensity of service, high risk for further deterioration and high frequency of surveillance required.*   For questions or updates, please contact [facility] Please consult [email or web address] for contact info under     Signed, Curtistine LITTIE Farr, MD  11/25/2024 3:43 AM       [1]  Allergies Allergen  Reactions   Mobic  [Meloxicam ] Other (See Comments)    Unknown reaction   "

## 2024-11-25 NOTE — Progress Notes (Signed)
 "   DAILY PROGRESS NOTE   Patient Name: Brittany Gomez Date of Encounter: 11/25/2024 Cardiologist: Brittany JAYSON Maxcy, MD  Chief Complaint   Tired  Patient Profile   Brittany Gomez is a 74 y.o. female with primary hypertension, hypercholesterolemia, tobacco use, lung nodule, emphysema and atrial flutter who is being seen 11/25/2024 for the evaluation of atrial flutter.   Subjective   Brittany Gomez remains in atrial flutter with 2:1 conduction -rates in the 130's. She has been compliant with Eliquis , no missed doses. Defib pads in place.  Objective   Vitals:   11/25/24 0700 11/25/24 0730 11/25/24 0745 11/25/24 0905  BP: (!) 115/98  110/89 (!) 133/109  Pulse: (!) 133  (!) 131 (!) 133  Resp: (!) 21  20   Temp:  98.5 F (36.9 C)    TempSrc:  Oral    SpO2: 90%  91%     Intake/Output Summary (Last 24 hours) at 11/25/2024 9081 Last data filed at 11/25/2024 0309 Gross per 24 hour  Intake 750 ml  Output --  Net 750 ml   There were no vitals filed for this visit.  Physical Exam   General appearance: alert and no distress Lungs: clear to auscultation bilaterally Heart: regular tachycardia Extremities: extremities normal, atraumatic, no cyanosis or edema Neurologic: Grossly normal  Inpatient Medications    Scheduled Meds:  apixaban   5 mg Oral BID   metoprolol  succinate  25 mg Oral Daily   rosuvastatin   20 mg Oral Daily    Continuous Infusions:   PRN Meds: acetaminophen , ondansetron  (ZOFRAN ) IV   Labs   Results for orders placed or performed during the hospital encounter of 11/25/24 (from the past 48 hours)  CBC with Differential     Status: Abnormal   Collection Time: 11/25/24  3:23 AM  Result Value Ref Range   WBC 9.5 4.0 - 10.5 K/uL   RBC 4.84 3.87 - 5.11 MIL/uL   Hemoglobin 14.9 12.0 - 15.0 g/dL   HCT 52.4 (H) 63.9 - 53.9 %   MCV 98.1 80.0 - 100.0 fL   MCH 30.8 26.0 - 34.0 pg   MCHC 31.4 30.0 - 36.0 g/dL   RDW 86.9 88.4 - 84.4 %   Platelets 228 150 -  400 K/uL   nRBC 0.2 0.0 - 0.2 %   Neutrophils Relative % 62 %   Neutro Abs 6.0 1.7 - 7.7 K/uL   Lymphocytes Relative 28 %   Lymphs Abs 2.7 0.7 - 4.0 K/uL   Monocytes Relative 6 %   Monocytes Absolute 0.6 0.1 - 1.0 K/uL   Eosinophils Relative 2 %   Eosinophils Absolute 0.2 0.0 - 0.5 K/uL   Basophils Relative 1 %   Basophils Absolute 0.1 0.0 - 0.1 K/uL   Immature Granulocytes 1 %   Abs Immature Granulocytes 0.06 0.00 - 0.07 K/uL    Comment: Performed at [facility], [address], [place], [place] [address]  Protime-INR     Status: Abnormal   Collection Time: 11/25/24  3:23 AM  Result Value Ref Range   Prothrombin Time 16.1 (H) 11.4 - 15.2 seconds   INR 1.2 0.8 - 1.2    Comment: (NOTE) INR goal varies based on device and disease states. Performed at [facility], [address] [place], [place] [address]     ECG   Typical atrial flutter at 130 - Personally Reviewed  Telemetry   Typical atrial flutter at 135 - Personally Reviewed  Radiology    DG Chest Port 1 View Result Date: 11/25/2024 EXAM: 1 VIEW(S) XRAY OF THE CHEST 11/25/2024 03:52:00 AM COMPARISON: 10/28/2024 CLINICAL HISTORY: atrial flutter FINDINGS: LINES, TUBES AND DEVICES: Defibrillator pads noted. LUNGS AND PLEURA: Increased interstitial markings, favoring emphysematous changes. No focal pulmonary opacity. No pleural effusion. No pneumothorax. HEART AND MEDIASTINUM: Cardiomegaly. No acute abnormality of the mediastinal silhouette. BONES AND SOFT TISSUES: No acute osseous abnormality. IMPRESSION: 1. Cardiomegaly. 2. Emphysema (ICD10-J43.9). Electronically signed by: Pinkie Pebbles MD 11/25/2024 03:59 AM EST RP Workstation: [id number]    Cardiac Studies   N/A  Assessment   Principal Problem:   Atrial flutter [facility])   Plan   Brittany Gomez presented with symptomatic atrial flutter. She had previous DCCV in December and has been compliant with Eliquis . I advised repeat DCCV today. She has been NPO  p MN. D/w nurse who will give am Eliquis  and metoprolol . Plan for DCCV in endoscopy today. May be discharged after. I have asked cardiac EP to see in consult for possible flutter ablation, however, they said they would not be able to accomplish that today.  Informed Consent   Shared Decision Making/Informed Consent The risks (stroke, cardiac arrhythmias rarely resulting in the need for a temporary or permanent pacemaker, skin irritation or burns and complications associated with conscious sedation including aspiration, arrhythmia, respiratory failure and death), benefits (restoration of normal sinus rhythm) and alternatives of a direct current cardioversion were explained in detail to Brittany Gomez and she agrees to proceed.       Time Spent Directly with Patient:  I have spent a total of 25 minutes with the patient reviewing hospital notes, telemetry, EKGs, labs and examining the patient as well as establishing an assessment and plan that was discussed personally with the patient.  > 50% of time was spent in direct patient care.  Length of Stay:  LOS: 0 days   Brittany KYM Maxcy, MD, [facility] FNLA, FACP  [place]  [facility]  Medical Director of the [facility] Lipid [facility] &  Cardiovascular Risk Reduction Clinic Diplomate of the American Board of Clinical Lipidology Attending Cardiologist  Direct Dial: [phone]  Fax: [phone]  Website:  [email or web address]  Brittany Gomez 11/25/2024, 9:18 AM   "

## 2024-11-25 NOTE — Discharge Summary (Signed)
 Physician Discharge Summary  Brittany Gomez FMW:[id number] DOB: November 05, 1951   PCP: Arloa Elsie SAUNDERS, MD  Admit date: 11/25/2024 Discharge date: 11/25/2024 Length of Stay: 1 days   Code Status: Full Code  Admitted From:  Home Discharged to:   Home Home Health:  None  Equipment/Devices:  None Discharge Condition:  Stable  Recommendations for Outpatient Follow-up   Referred to EP to discuss ablation options.   Hospital Summary  74 year old female with history of hypertension, hyperlipidemia, tobacco use, lung nodule, emphysema, atrial flutter on Eliquis  5 mg twice daily with a recent hospitalization from 10/28/2024 to 10/31/2024 when she was first diagnosed with atrial flutter and underwent successful TEE/DCCV on 10/25/2024 who was woken up by her Apple Watch stating that she had a high heart rate and felt palpitations.  She was admitted earlier this morning, 11/25/2024, with recurrent atrial flutter with RVR.  She has been compliant with her Eliquis  and has not missed any doses.  She underwent successful DCCV at 200 J with return to normal sinus rhythm.  She is being discharged on the same day in stable condition.  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 Hypertension Hyperlipidemia Prior tobacco use    Consultants  None  Procedures  DCCV at 200 J  Antibiotics   Anti-infectives (From admission, onward)    None         Objective   Discharge Exam: Vitals:   11/25/24 1100 11/25/24 1105  BP: 124/83 122/83  Pulse: 71 73  Resp: 15 (!) 21  Temp:    SpO2: 94% (!) 89%   Vitals:   11/25/24 1050 11/25/24 1055 11/25/24 1100 11/25/24 1105  BP: (!) 119/91 119/75 124/83 122/83  Pulse: 76 73 71 73  Resp: 15 18 15  (!) 21   Temp: 98.3 F (36.8 C)     TempSrc: Oral     SpO2: 98% 96% 94% (!) 89%    Physical Exam Constitutional:      Appearance: She is not ill-appearing.  Cardiovascular:     Rate and Rhythm: Normal rate.  Pulmonary:     Effort: Pulmonary effort is normal.  Abdominal:     General: Abdomen is flat.  Musculoskeletal:     Right lower leg: No edema.     Left lower leg: No edema.  Neurological:     Mental Status: She is alert.       The results of significant diagnostics from this hospitalization (including imaging, microbiology, ancillary and laboratory) are listed below for reference.     Microbiology: No results found for this or any previous visit (from the past 240 hours).   Labs: BNP (last 3 results) No results for input(s): BNP in the last 8760 hours. Basic Metabolic Panel: Recent Labs  Lab 11/21/24 0941  NA 143  K 5.0  CL 100  CO2 31*  GLUCOSE 74  BUN 9  CREATININE 0.74  CALCIUM  9.3   Liver Function Tests: Recent Labs  Lab 11/21/24 0941  AST 21  ALT 26  ALKPHOS 78  BILITOT 0.5  PROT 6.3  ALBUMIN 4.0   No results for input(s): LIPASE, AMYLASE in the last 168 hours. No results for input(s): AMMONIA in the last 168 hours. CBC: Recent Labs  Lab 11/21/24  9058 11/25/24 0323  WBC 8.8 9.5  NEUTROABS  --  6.0  HGB 14.6 14.9  HCT 46.4 47.5*  MCV 97 98.1  PLT 293 228   Cardiac Enzymes: No results for input(s): CKTOTAL, CKMB, CKMBINDEX, TROPONINI in the last 168 hours. BNP: Invalid input(s): POCBNP CBG: No results for input(s): GLUCAP in the last 168 hours. D-Dimer No results for input(s): DDIMER in the last 72 hours. Hgb A1c No results for input(s): HGBA1C in the last 72 hours. Lipid Profile No results for input(s): CHOL, HDL, LDLCALC, TRIG, CHOLHDL, LDLDIRECT in the last 72 hours. Thyroid  function studies No results for input(s): TSH, T4TOTAL, T3FREE, THYROIDAB in the last 72 hours.  Invalid  input(s): FREET3 Anemia work up No results for input(s): VITAMINB12, FOLATE, FERRITIN, TIBC, IRON, RETICCTPCT in the last 72 hours. Urinalysis    Component Value Date/Time   COLORURINE RED (A) 07/15/2011 1101   APPEARANCEUR TURBID (A) 07/15/2011 1101   LABSPEC 1.021 07/15/2011 1101   PHURINE 6.0 07/15/2011 1101   GLUCOSEU NEGATIVE 07/15/2011 1101   HGBUR LARGE (A) 07/15/2011 1101   BILIRUBINUR NEGATIVE 07/15/2011 1101   KETONESUR TRACE (A) 07/15/2011 1101   PROTEINUR >300 (A) 07/15/2011 1101   UROBILINOGEN 0.2 07/15/2011 1101   NITRITE NEGATIVE 07/15/2011 1101   LEUKOCYTESUR LARGE (A) 07/15/2011 1101   Sepsis Labs Recent Labs  Lab 11/21/24 0941 11/25/24 0323  WBC 8.8 9.5   Microbiology No results found for this or any previous visit (from the past 240 hours).  Discharge Instructions     Discharge Instructions     Amb referral to AFIB Clinic   Complete by: As directed    Increase activity slowly   Complete by: As directed    Increase activity slowly   Complete by: As directed       Allergies as of 11/25/2024       Reactions   Mobic  [meloxicam ] Hives        Medication List     TAKE these medications    clonazePAM 1 MG tablet Commonly known as: KLONOPIN Take 0.5-1 mg by mouth 2 (two) times daily as needed for anxiety.   Eliquis  5 MG Tabs tablet Generic drug: apixaban  Take 1 tablet (5 mg total) by mouth 2 (two) times daily. First month supply, then refills from [facility]   apixaban  5 MG Tabs tablet Commonly known as: ELIQUIS  Take 1 tablet (5 mg total) by mouth 2 (two) times daily. To start 12/01/24 (after 1 mo supply from [place] ends)   levocetirizine 5 MG tablet Commonly known as: XYZAL Take 5 mg by mouth daily as needed for allergies.   metoprolol  succinate 50 MG 24 hr tablet Commonly known as: TOPROL -XL Take 1 tablet (50 mg total) by mouth daily. What changed:  medication strength how much to take   mirtazapine  30 MG  tablet Commonly known as: REMERON  Take 15-30 mg by mouth at bedtime as needed (sleep).   rosuvastatin  20 MG tablet Commonly known as: CRESTOR  Take 1 tablet (20 mg total) by mouth daily.   tiZANidine 4 MG tablet Commonly known as: ZANAFLEX Take 4 mg by mouth 2 (two) times daily as needed for muscle spasms.        Allergies[1] Status is: Observation The patient remains OBS appropriate and will d/c before 2 midnights.   Time coordinating discharge: Over 30 minutes   SIGNED:   Emeline FORBES Calender, D.O. [facility] Pager: [phone]  11/25/2024, 11:37 AM     [1]  Allergies  Allergen Reactions   Mobic  [email or web address] ] Hives

## 2024-11-25 NOTE — ED Notes (Signed)
 CCMD called.

## 2024-11-25 NOTE — Care Management CC44 (Signed)
"         Condition Code 44 Documentation Completed  Patient Details  Name: Tillie Viverette MRN: [id number] Date of Birth: 1951-10-17   Condition Code 44 given:  Yes Patient signature on Condition Code 44 notice:  Yes Documentation of 2 MD's agreement:  Yes Code 44 added to claim:  Yes    Nena LITTIE Coffee, RN 11/25/2024, 11:08 AM  "

## 2024-11-26 ENCOUNTER — Ambulatory Visit: Admitting: Physician Assistant

## 2024-12-04 NOTE — Progress Notes (Incomplete)
 "  Primary Care Physician: Arloa Elsie SAUNDERS, MD Primary Cardiologist: Vinie JAYSON Maxcy, MD Electrophysiologist: Fonda Kitty, MD  Referring Physician: Fonda Kitty, MD  Brittany Gomez is a 74 y.o. female with a history of paroxysmal A-fib, coronary artery calcifications, HTN, HLD, tobacco abuse who presents for follow up in the [facility].  The patient was initially diagnosed with atrial fibrillation on 10/28/2024 when she was seen in [place] tried urgent care with EKG revealing atrial flutter and was sent to the ED.  She was hypotensive on presentation with BPs in the 90s over 70s and underwent TEE/DCCV on 10/25/2024.  She was started on Eliquis  5 mg twice daily and TEE revealed moderately reduced RV systolic function with severely dilated right atrium.  She was advised to follow-up with PCP for outpatient pulmonary function testing to rule out WHO group 2 PHT.  She was planned to be discharged on 12/25 however was delayed due to hypotension and transportation issues.  She was subsequently discharged on 10/31/2024 and was maintaining sinus rhythm.  She presented back to the ED on 11/25/2024 with symptomatic atrial flutter alerted by her Apple Watch and underwent DCCV with conversion to sinus rhythm and was discharged in stable condition.  Today, she denies symptoms of ***palpitations, chest pain, shortness of breath, orthopnea, PND, lower extremity edema, dizziness, presyncope, syncope, snoring, daytime somnolence, bleeding, or neurologic sequela. The patient is tolerating medications without difficulties and is otherwise without complaint today.   ***Discussed the use of AI scribe software for clinical note transcription with the patient, who gave verbal consent to proceed.   Notes: She will need referral to EP to discuss ablation -If recurrence of flutter may require bridge to flutter ablation -Multaq, no class Ic, Tikosyn, amiodarone -on Toprol  XL 50 mg  QD  Atrial Fibrillation Management history: History of Sleep Apnea*** History of alcohol use*** History of early familial atrial fibrillation or other arrhythmias *** Previous antiarrhythmic drugs: None Previous cardioversions: TEE/DCCV 10/29/2024 and 11/25/24 Previous ablations: None Anticoagulation history: Eliquis   ROS- All systems are reviewed and negative except as per the HPI above.  Past Medical History:  Diagnosis Date   Allergy    Anxiety    Arthritis    Hypertension    Plantar fasciitis    STD (sexually transmitted disease)    treated for chlamydia in her 20's   Urinary incontinence    Past Surgical History:  Procedure Laterality Date   CARDIOVERSION N/A 10/29/2024   Procedure: CARDIOVERSION;  Surgeon: Ren Donley Joelle VEAR, MD;  Location: [facility] INVASIVE CV LAB;  Service: Cardiovascular;  Laterality: N/A;   CARDIOVERSION N/A 11/25/2024   Procedure: CARDIOVERSION;  Surgeon: Kriste Emeline BRAVO, DO;  Location: [facility] INVASIVE CV LAB;  Service: Cardiovascular;  Laterality: N/A;   CESAREAN SECTION     TRANSESOPHAGEAL ECHOCARDIOGRAM (CATH LAB) N/A 10/29/2024   Procedure: TRANSESOPHAGEAL ECHOCARDIOGRAM;  Surgeon: Ren Donley Joelle VEAR, MD;  Location: [facility] INVASIVE CV LAB;  Service: Cardiovascular;  Laterality: N/A;   Mobic  [meloxicam ] Current Outpatient Medications  Medication Sig Dispense Refill   apixaban  (ELIQUIS ) 5 MG TABS tablet Take 1 tablet (5 mg total) by mouth 2 (two) times daily. First month supply, then [facility]ills [facility] 60 tablet 0   apixaban  (ELIQUIS ) 5 MG TABS tablet Take 1 tablet (5 mg total) by mouth 2 (two) times daily. To start 12/01/24 (after 1 mo supply from [place] ends) 60 tablet 11   clonazePAM (KLONOPIN) 1 MG tablet Take 0.5-1 mg by  mouth 2 (two) times daily as needed for anxiety.     levocetirizine (XYZAL) 5 MG tablet Take 5 mg by mouth daily as needed for allergies.     metoprolol  succinate (TOPROL -XL) 50 MG 24 hr tablet Take 1 tablet (50 mg total) by  mouth daily. 30 tablet 0   mirtazapine  (REMERON ) 30 MG tablet Take 15-30 mg by mouth at bedtime as needed (sleep).     rosuvastatin  (CRESTOR ) 20 MG tablet Take 1 tablet (20 mg total) by mouth daily. 90 tablet 3   tiZANidine (ZANAFLEX) 4 MG tablet Take 4 mg by mouth 2 (two) times daily as needed for muscle spasms.     No current facility-administered medications for this visit.    Physical Exam: LMP 11/06/1998   GEN: Well nourished, well developed in no acute distress NECK: No JVD; No carotid bruits CARDIAC: {EPRHYTHM:[id number]}, no murmurs, rubs, gallops RESPIRATORY:  Clear to auscultation without rales, wheezing or rhonchi  ABDOMEN: Soft, non-tender, non-distended EXTREMITIES:  No edema; No deformity   Wt Readings from Last 3 Encounters:  11/21/24 60.6 kg  10/29/24 56.7 kg  12/14/20 71.4 kg    Lab Results  Component Value Date   TSH 1.130 10/28/2024   EKG today demonstrates:   EKG Interpretation Date/Time:    Ventricular Rate:    PR Interval:    QRS Duration:    QT Interval:    QTC Calculation:   R Axis:      Text Interpretation:          Echo Completed 10/30/2024: Comments no 1. Left ventricular ejection fraction, by estimation, is 60 to 65%. The  left ventricle has normal function. The left ventricle has no regional  wall motion abnormalities. Left ventricular diastolic parameters are  indeterminate. There is the  interventricular septum is flattened in systole and diastole, consistent  with right ventricular pressure and volume overload.   2. Right ventricular systolic function is moderately reduced. The right  ventricular size is severely enlarged. There is severely elevated  pulmonary artery systolic pressure.   3. Left atrial size was severely dilated.   4. Right atrial size was severely dilated.   5. The mitral valve is normal in structure. No evidence of mitral valve  regurgitation. No evidence of mitral stenosis.   6. The tricuspid valve is abnormal.  Tricuspid valve regurgitation is  moderate.   7. The aortic valve has an indeterminant number of cusps. There is mild  calcification of the aortic valve. There is mild thickening of the aortic  valve. Aortic valve regurgitation is not visualized. No aortic stenosis is  present.   8. The inferior vena cava is dilated in size with <50% respiratory  variability, suggesting right atrial pressure of 15 mmHg.   9. Agitated saline contrast bubble study was negative, with no evidence  of any interatrial shunt.  Happy Friday  CHA2DS2-VASc Score = 3  The patient's score is based upon: CHF History: 0 HTN History: 1 Diabetes History: 0 Stroke History: 0 Vascular Disease History: 0 Age Score: 1 Gender Score: 1   {Confirm score is correct.  If not, click here to update score.  REFRESH note.  :1}   ASSESSMENT AND PLAN: {Select the correct AFib Diagnosis                 :[id number]}   Signed,  Wyn Raddle, Jackee Shove, NP    12/04/2024 8:30 AM     {Are you ordering a CV Procedure (e.g. stress test,  cath, DCCV, TEE, etc)?   Press F2        :[id number]}  Do not forget to refresh clinic note for EKG  Follow up with the AF Clinic in ***  {Click to Open Review  :1}  "

## 2024-12-05 ENCOUNTER — Encounter: Payer: Self-pay | Admitting: Cardiology

## 2024-12-05 ENCOUNTER — Ambulatory Visit (HOSPITAL_COMMUNITY): Admitting: Nurse Practitioner

## 2024-12-05 ENCOUNTER — Ambulatory Visit (HOSPITAL_COMMUNITY): Attending: Cardiology | Admitting: Cardiology

## 2024-12-05 VITALS — BP 138/90 | HR 79 | Ht 65.0 in | Wt 125.0 lb

## 2024-12-05 DIAGNOSIS — I272 Pulmonary hypertension, unspecified: Secondary | ICD-10-CM

## 2024-12-05 DIAGNOSIS — D6869 Other thrombophilia: Secondary | ICD-10-CM | POA: Diagnosis not present

## 2024-12-05 DIAGNOSIS — I4892 Unspecified atrial flutter: Secondary | ICD-10-CM

## 2024-12-05 DIAGNOSIS — I483 Typical atrial flutter: Secondary | ICD-10-CM

## 2024-12-05 NOTE — Patient Instructions (Signed)
 Medication Instructions:  Your physician recommends that you continue on your current medications as directed. Please refer to the Current Medication list given to you today.  *If you need a refill on your cardiac medications before your next appointment, please call your pharmacy*  Testing/Procedures: Echocardiogram  Your physician has requested that you have an echocardiogram. Echocardiography is a painless test that uses sound waves to create images of your heart. It provides your doctor with information about the size and shape of your heart and how well your hearts chambers and valves are working. This procedure takes approximately one hour. There are no restrictions for this procedure. Please do NOT wear cologne, perfume, aftershave, or lotions (deodorant is allowed). Please arrive 15 minutes prior to your appointment time.  Please note: We ask at that you not bring children with you during ultrasound (echo/ vascular) testing. Due to room size and safety concerns, children are not allowed in the ultrasound rooms during exams. Our front office staff cannot provide observation of children in our lobby area while testing is being conducted. An adult accompanying a patient to their appointment will only be allowed in the ultrasound room at the discretion of the ultrasound technician under special circumstances. We apologize for any inconvenience.   Ablation Your physician has recommended that you have an ablation. Catheter ablation is a medical procedure used to treat some cardiac arrhythmias (irregular heartbeats). During catheter ablation, a long, thin, flexible tube is put into a blood vessel in your groin (upper thigh), or neck. This tube is called an ablation catheter. It is then guided to your heart through the blood vessel. Radio frequency waves destroy small areas of heart tissue where abnormal heartbeats may cause an arrhythmia to start.   You are scheduled for Atrial Flutter on  Thursday, March 5 with Dr. Dr. Kennyth. Please arrive at the Main Entrance A at [facility]: [address] [place], [place] [address] at 5:30 AM   What To Expect:  Labs: you will need to have lab work drawn within 30 days of your procedure. Please go to any [facility] location to have these drawn - no appointment is needed. You will receive procedure instructions either through MyChart or in the mail 4-6 week prior to your procedure.  After your procedure we recommend no driving for 4 days, no lifting over 5 lbs for 7 days, and no work or strenuous activity for 7 days.  Please contact our office at [phone] if you have any questions.    Follow-Up: We will contact you to schedule your post-procedure appointments.

## 2024-12-06 NOTE — Progress Notes (Signed)
 " Electrophysiology Office Note:   Date:  12/06/2024  ID:  Brittany Gomez, DOB 1951-03-26, MRN [id number]  Primary Cardiologist: Vinie JAYSON Maxcy, MD Electrophysiologist: Fonda Kitty, MD      History of Present Illness:   Brittany Gomez is a 74 y.o. female with h/o primary hypertension, hypercholesterolemia, tobacco use, lung nodule, emphysema and atrial flutter who is being seen today for evaluation for catheter ablation.  Discussed the use of AI scribe software for clinical note transcription with the patient, who gave verbal consent to proceed.  History of Present Illness Brittany Gomez is a 74 year old female with atrial flutter who presents for evaluation of her heart rhythm disorder. She was referred by Dr. Monette for evaluation of her atrial flutter.  She has been experiencing episodes of atrial flutter, which led to a recent hospitalization. Her smartwatch alerted her to an elevated heart rate, described as 'pounding' and 'racing'. During these episodes, she feels tired and nervous.  She has had at least two episodes of atrial flutter, with the second episode occurring less than a month after the first, which she found particularly frightening.  She is currently on a blood thinner.   Review of systems complete and found to be negative unless listed in HPI.   EP Information / Studies Reviewed:    EKG is ordered today. Personal review as below.  EKG Interpretation Date/Time:  Friday December 05 2024 09:59:32 EST Ventricular Rate:  79 PR Interval:  150 QRS Duration:  80 QT Interval:  384 QTC Calculation: 440 R Axis:   209  Text Interpretation: Sinus rhythm with occasional Premature ventricular complexes Biatrial enlargement Right superior axis deviation Right ventricular hypertrophy Cannot rule out Anteroseptal infarct (cited on or before 25-Nov-2024) When compared with ECG of 05-Dec-2024 09:59, No significant change was found Confirmed by Kitty Fonda [phone]) on 12/06/2024 8:28:24 PM   ECG 10/29/24: AFL with 2:1 conduction   Echo 10/30/24:  1. Left ventricular ejection fraction, by estimation, is 60 to 65%. The  left ventricle has normal function. The left ventricle has no regional  wall motion abnormalities. Left ventricular diastolic parameters are  indeterminate. There is the  interventricular septum is flattened in systole and diastole, consistent  with right ventricular pressure and volume overload.   2. Right ventricular systolic function is moderately reduced. The right  ventricular size is severely enlarged. There is severely elevated  pulmonary artery systolic pressure.   3. Left atrial size was severely dilated.   4. Right atrial size was severely dilated.   5. The mitral valve is normal in structure. No evidence of mitral valve  regurgitation. No evidence of mitral stenosis.   6. The tricuspid valve is abnormal. Tricuspid valve regurgitation is  moderate.   7. The aortic valve has an indeterminant number of cusps. There is mild  calcification of the aortic valve. There is mild thickening of the aortic  valve. Aortic valve regurgitation is not visualized. No aortic stenosis is  present.   8. The inferior vena cava is dilated in size with <50% respiratory  variability, suggesting right atrial pressure of 15 mmHg.   9. Agitated saline contrast bubble study was negative, with no evidence  of any interatrial shunt.       Physical Exam:   VS:  BP (!) 138/90 (BP Location: Left Arm, Patient Position: Sitting, Cuff Size: Normal)   Pulse 79   Ht 5' 5 (1.651 m)   Wt 125 lb (56.7 kg)  LMP 11/06/1998   SpO2 95%   BMI 20.80 kg/m    Wt Readings from Last 3 Encounters:  12/05/24 125 lb (56.7 kg)  11/21/24 133 lb 9.6 oz (60.6 kg)  10/29/24 125 lb (56.7 kg)     General: Well developed, in no acute distress.  Neck: No JVD.  Cardiac: Normal rate, regular rhythm.  Resp: Normal work of breathing.  Ext: No edema.   Neuro: No gross focal deficits.  Psych: Normal affect.    ASSESSMENT AND PLAN:    #Atrial flutter: Likely typical. Symptomatic and recurrent.  #Hypercoagulable state due to AFL: -Discussed treatment options today for AFL including antiarrhythmic drug therapy and ablation. Discussed risks, recovery and likelihood of success with each treatment strategy. Risk, benefits, and alternatives to EP study and ablation for AFL were discussed. These risks include but are not limited to stroke, bleeding, vascular damage, tamponade, perforation, damage to the esophagus, lungs, phrenic nerve and other structures, pulmonary vein stenosis, worsening renal function, coronary vasospasm and death.  Discussed potential need for repeat ablation procedures and antiarrhythmic drugs after an initial ablation. The patient understands these risk and wishes to proceed.  We will therefore proceed with catheter ablation at the next available time.  Carto, ICE, anesthesia are requested for the procedure.   -Plan for CTI ablation only given her pulmonary HTN, potentially with moderate sedation only. -We will repeat an echo in sinus prior to ablation.  -Continue Eliquis  5mg  BID.  -Continue metoprolol  XL 50mg  daily.  #Pulmonary HTN/Dilated RA/RV:  - Appears well compensated on exam today.  - Follow up with general cardiology.  Follow up with EP Team as usual post procedure  Signed, Fonda Kitty, MD  "

## 2024-12-08 ENCOUNTER — Telehealth: Payer: Self-pay

## 2025-01-08 ENCOUNTER — Encounter (HOSPITAL_COMMUNITY): Payer: Self-pay

## 2025-01-08 ENCOUNTER — Ambulatory Visit (HOSPITAL_COMMUNITY): Admit: 2025-01-08 | Admitting: Cardiology

## 2025-01-15 ENCOUNTER — Ambulatory Visit (HOSPITAL_COMMUNITY)
